# Patient Record
Sex: Male | Born: 1970 | Hispanic: No | Marital: Married | Smoking: Never smoker
Health system: Southern US, Community
[De-identification: ages and names within clinical notes are randomized; demographics above are authoritative.]

## PROBLEM LIST (undated history)

## (undated) DIAGNOSIS — L0390 Cellulitis, unspecified: Secondary | ICD-10-CM

## (undated) DIAGNOSIS — E119 Type 2 diabetes mellitus without complications: Secondary | ICD-10-CM

## (undated) DIAGNOSIS — I6529 Occlusion and stenosis of unspecified carotid artery: Secondary | ICD-10-CM

---

## 2009-06-22 ENCOUNTER — Emergency Department (HOSPITAL_COMMUNITY): Admission: EM | Admit: 2009-06-22 | Discharge: 2009-06-22 | Payer: Self-pay | Admitting: Emergency Medicine

## 2014-09-07 ENCOUNTER — Emergency Department (HOSPITAL_COMMUNITY): Payer: Self-pay

## 2014-09-07 ENCOUNTER — Encounter (HOSPITAL_COMMUNITY): Payer: Self-pay | Admitting: Emergency Medicine

## 2014-09-07 ENCOUNTER — Inpatient Hospital Stay (HOSPITAL_COMMUNITY)
Admission: EM | Admit: 2014-09-07 | Discharge: 2014-09-09 | DRG: 194 | Disposition: A | Discharge status: Home / Self Care | Payer: Self-pay | Attending: Internal Medicine | Admitting: Internal Medicine

## 2014-09-07 DIAGNOSIS — E871 Hypo-osmolality and hyponatremia: Secondary | ICD-10-CM

## 2014-09-07 DIAGNOSIS — R197 Diarrhea, unspecified: Secondary | ICD-10-CM | POA: Diagnosis present

## 2014-09-07 DIAGNOSIS — E1165 Type 2 diabetes mellitus with hyperglycemia: Secondary | ICD-10-CM | POA: Diagnosis present

## 2014-09-07 DIAGNOSIS — R109 Unspecified abdominal pain: Secondary | ICD-10-CM

## 2014-09-07 DIAGNOSIS — J181 Lobar pneumonia, unspecified organism: Principal | ICD-10-CM | POA: Diagnosis present

## 2014-09-07 DIAGNOSIS — R7401 Elevation of levels of liver transaminase levels: Secondary | ICD-10-CM

## 2014-09-07 DIAGNOSIS — R740 Nonspecific elevation of levels of transaminase and lactic acid dehydrogenase [LDH]: Secondary | ICD-10-CM | POA: Diagnosis present

## 2014-09-07 DIAGNOSIS — D696 Thrombocytopenia, unspecified: Secondary | ICD-10-CM

## 2014-09-07 DIAGNOSIS — F17220 Nicotine dependence, chewing tobacco, uncomplicated: Secondary | ICD-10-CM | POA: Diagnosis present

## 2014-09-07 LAB — COMPREHENSIVE METABOLIC PANEL
ALBUMIN: 3.5 g/dL (ref 3.5–5.0)
ALK PHOS: 164 U/L — AB (ref 38–126)
ALT: 92 U/L — ABNORMAL HIGH (ref 17–63)
ANION GAP: 9 (ref 5–15)
AST: 211 U/L — AB (ref 15–41)
BUN: 12 mg/dL (ref 6–20)
CHLORIDE: 96 mmol/L — AB (ref 101–111)
CO2: 21 mmol/L — ABNORMAL LOW (ref 22–32)
Calcium: 8 mg/dL — ABNORMAL LOW (ref 8.9–10.3)
Creatinine, Ser: 0.88 mg/dL (ref 0.61–1.24)
GFR calc Af Amer: >60 mL/min (ref >60)
GFR calc non Af Amer: >60 mL/min (ref >60)
GLUCOSE: 167 mg/dL — AB (ref 70–99)
POTASSIUM: 3.7 mmol/L (ref 3.5–5.1)
Sodium: 126 mmol/L — ABNORMAL LOW (ref 135–145)
TOTAL PROTEIN: 7.4 g/dL (ref 6.5–8.1)
Total Bilirubin: 1.5 mg/dL — ABNORMAL HIGH (ref 0.3–1.2)

## 2014-09-07 LAB — URINALYSIS, ROUTINE W REFLEX MICROSCOPIC
BILIRUBIN URINE: NEGATIVE
Glucose, UA: NEGATIVE mg/dL
Hgb urine dipstick: NEGATIVE
KETONES UR: NEGATIVE mg/dL
LEUKOCYTES UA: NEGATIVE
NITRITE: NEGATIVE
PH: 6.5 (ref 5.0–8.0)
Protein, ur: NEGATIVE mg/dL
SPECIFIC GRAVITY, URINE: 1.018 (ref 1.005–1.030)
Urobilinogen, UA: 0.2 mg/dL (ref 0.0–1.0)

## 2014-09-07 LAB — HIV ANTIBODY (ROUTINE TESTING W REFLEX): HIV SCREEN 4TH GENERATION: NONREACTIVE

## 2014-09-07 LAB — CBC WITH DIFFERENTIAL/PLATELET
Basophils Absolute: 0 10*3/uL (ref 0.0–0.1)
Basophils Relative: 0 % (ref 0–1)
Eosinophils Absolute: 0 10*3/uL (ref 0.0–0.7)
Eosinophils Relative: 0 % (ref 0–5)
HCT: 44.3 % (ref 39.0–52.0)
HEMOGLOBIN: 15.9 g/dL (ref 13.0–17.0)
LYMPHS ABS: 1.3 10*3/uL (ref 0.7–4.0)
Lymphocytes Relative: 20 % (ref 12–46)
MCH: 30.1 pg (ref 26.0–34.0)
MCHC: 35.9 g/dL (ref 30.0–36.0)
MCV: 83.7 fL (ref 78.0–100.0)
Monocytes Absolute: 0.7 10*3/uL (ref 0.1–1.0)
Monocytes Relative: 10 % (ref 3–12)
NEUTROS ABS: 4.6 10*3/uL (ref 1.7–7.7)
Neutrophils Relative %: 70 % (ref 43–77)
Platelets: 128 10*3/uL — ABNORMAL LOW (ref 150–400)
RBC: 5.29 MIL/uL (ref 4.22–5.81)
RDW: 12.6 % (ref 11.5–15.5)
WBC: 6.6 10*3/uL (ref 4.0–10.5)

## 2014-09-07 LAB — RAPID URINE DRUG SCREEN, HOSP PERFORMED
AMPHETAMINES: NOT DETECTED
BENZODIAZEPINES: NOT DETECTED
Barbiturates: NOT DETECTED
Cocaine: NOT DETECTED
OPIATES: NOT DETECTED
Tetrahydrocannabinol: NOT DETECTED

## 2014-09-07 LAB — TECHNOLOGIST SMEAR REVIEW

## 2014-09-07 LAB — LACTATE DEHYDROGENASE: LDH: 317 U/L — AB (ref 98–192)

## 2014-09-07 LAB — ETHANOL

## 2014-09-07 LAB — STREP PNEUMONIAE URINARY ANTIGEN: STREP PNEUMO URINARY ANTIGEN: NEGATIVE

## 2014-09-07 LAB — PROTIME-INR
INR: 0.91 (ref 0.00–1.49)
Prothrombin Time: 12.4 seconds (ref 11.6–15.2)

## 2014-09-07 LAB — LACTIC ACID, PLASMA: Lactic Acid, Venous: 1.1 mmol/L (ref 0.5–2.0)

## 2014-09-07 LAB — PROCALCITONIN: Procalcitonin: <0.1 ng/mL

## 2014-09-07 LAB — HEPATITIS B SURFACE ANTIGEN: HEP B S AG: NEGATIVE

## 2014-09-07 LAB — VITAMIN B12: Vitamin B-12: 461 pg/mL (ref 180–914)

## 2014-09-07 LAB — CLOSTRIDIUM DIFFICILE BY PCR: CDIFFPCR: NEGATIVE

## 2014-09-07 LAB — HEPATITIS C ANTIBODY: HCV AB: NEGATIVE

## 2014-09-07 LAB — APTT: aPTT: 32 seconds (ref 24–37)

## 2014-09-07 MED ORDER — ONDANSETRON HCL 4 MG/2ML IJ SOLN: 4.0000 mg | Freq: Four times a day (QID) | INTRAMUSCULAR | Status: DC | PRN

## 2014-09-07 MED ORDER — ONDANSETRON HCL 4 MG/2ML IJ SOLN
4.0000 mg | Freq: Once | INTRAMUSCULAR | Status: AC
  Administered 2014-09-07: 4 mg via INTRAVENOUS
  Filled 2014-09-07: qty 2

## 2014-09-07 MED ORDER — DEXTROSE 5 % IV SOLN
1.0000 g | Freq: Once | INTRAVENOUS | Status: AC
  Administered 2014-09-07: 1 g via INTRAVENOUS
  Filled 2014-09-07: qty 10

## 2014-09-07 MED ORDER — ACETAMINOPHEN 650 MG RE SUPP: 650.0000 mg | Freq: Four times a day (QID) | RECTAL | Status: DC | PRN

## 2014-09-07 MED ORDER — SODIUM CHLORIDE 0.9 % IV BOLUS (SEPSIS)
1000.0000 mL | Freq: Once | INTRAVENOUS | Status: AC
  Administered 2014-09-07: 1000 mL via INTRAVENOUS

## 2014-09-07 MED ORDER — POTASSIUM CHLORIDE 2 MEQ/ML IV SOLN
INTRAVENOUS | Status: DC
  Administered 2014-09-07 – 2014-09-09 (×4): via INTRAVENOUS
  Filled 2014-09-07 (×7): qty 1000

## 2014-09-07 MED ORDER — HEPARIN SODIUM (PORCINE) 5000 UNIT/ML IJ SOLN
5000.0000 [IU] | Freq: Three times a day (TID) | INTRAMUSCULAR | Status: DC
  Administered 2014-09-07 – 2014-09-09 (×6): 5000 [IU] via SUBCUTANEOUS
  Filled 2014-09-07 (×6): qty 1

## 2014-09-07 MED ORDER — ACETAMINOPHEN 325 MG PO TABS
650.0000 mg | ORAL_TABLET | Freq: Four times a day (QID) | ORAL | Status: DC | PRN
Start: 2014-09-07 — End: 2014-09-09
  Administered 2014-09-07: 650 mg via ORAL
  Filled 2014-09-07: qty 2

## 2014-09-07 MED ORDER — CEFTRIAXONE SODIUM IN DEXTROSE 20 MG/ML IV SOLN
1.0000 g | INTRAVENOUS | Status: DC
  Administered 2014-09-08 – 2014-09-09 (×2): 1 g via INTRAVENOUS
  Filled 2014-09-07 (×2): qty 50

## 2014-09-07 MED ORDER — DEXTROSE 5 % IV SOLN
500.0000 mg | Freq: Once | INTRAVENOUS | Status: AC
  Administered 2014-09-07: 500 mg via INTRAVENOUS
  Filled 2014-09-07: qty 500

## 2014-09-07 MED ORDER — ONDANSETRON HCL 4 MG PO TABS: 4.0000 mg | ORAL_TABLET | Freq: Four times a day (QID) | ORAL | Status: DC | PRN

## 2014-09-07 MED ORDER — IOHEXOL 300 MG/ML  SOLN
100.0000 mL | Freq: Once | INTRAMUSCULAR | Status: AC | PRN
  Administered 2014-09-07: 100 mL via INTRAVENOUS

## 2014-09-07 MED ORDER — IOHEXOL 300 MG/ML  SOLN
50.0000 mL | Freq: Once | INTRAMUSCULAR | Status: AC | PRN
  Administered 2014-09-07: 50 mL via ORAL

## 2014-09-07 MED ORDER — SODIUM CHLORIDE 0.9 % IJ SOLN
3.0000 mL | Freq: Two times a day (BID) | INTRAMUSCULAR | Status: DC
  Administered 2014-09-07: 3 mL via INTRAVENOUS

## 2014-09-07 MED ORDER — DEXTROSE 5 % IV SOLN
500.0000 mg | INTRAVENOUS | Status: DC
  Administered 2014-09-08 – 2014-09-09 (×2): 500 mg via INTRAVENOUS
  Filled 2014-09-07 (×2): qty 500

## 2014-09-07 MED ORDER — SULFAMETHOXAZOLE-TRIMETHOPRIM 800-160 MG PO TABS
1.0000 | ORAL_TABLET | Freq: Once | ORAL | Status: AC
  Administered 2014-09-07: 1 via ORAL
  Filled 2014-09-07: qty 1

## 2014-09-07 NOTE — H&P (Signed)
History and Physical  Bryan Mueller ZOX:[id number] DOB: 05/17/1970 DOA: 09/07/2014   PCP: No primary care provider on file.   Chief Complaint: cough, malaise, myalgia  HPI:  44 year old male who states that he has no chronic medical conditions presented with 4 day history of nonproductive cough and subjective fevers, chills, and rigors.  He did not have a thermometer to check his temperature. The patient has also been complaining of myalgias and arthralgias with bifrontal headache. He denies any focal extremity weakness or visual disturbance. He denies any neck pain. Finally, the patient has been having loose stools for the past 24 hours. He states that he had 5 watery stools on the day prior to admission without any hematochezia or melena. He has some lower abdominal pain for the past 24 hours. He denies any unusual rashes, synovitis, nausea, vomiting, chest pain, shortness of breath, hemoptysis. He has not seen a physician in 8 years. He is originally from [place] and has lived in [place] for 12 years. He denies any dysuria or hematuria.  In the emergency department, serum sodium was 126 with bicarbonate 21. AST 211, ALT 92, alkaline phosphatase is 164, total bilirubin 1.5. WBC was 6.6 with platelets 120,000. Urinalysis negative for pyuria. CT of the abdomen and pelvis shows bibasilar opacities, right greater than left. There is slight wall thickening of the sigmoid colon with a fluid-filled colon. Urinalysis is negative for pyuria Assessment/Plan: Lobar pneumonia -patient's clinical presentation is suspicious for an atypical pathogen given his headache, loose stools, not productive cough, and chest x-ray findings without overwhelming consolidation -Urine Legionella antigen -Urine Streptococcus pneumoniae antigen -Continue ceftriaxone and azithromycin -Try to induce sputum for PCP DFA -HIV antibody -Lactic acid -Procalcitonin -Respiratory viral panel -LDH -UDS Thrombocytopenia -May be  due to the patient's infectious process -HIV antibody -serum B12 -INR and PTT -Peripheral smear Diarrhea -C. difficile PCR -Stool pathogen panel Transaminasemia -no RUQ pain -likely due to infection -trend -CT shows normal GB -Hepatitis B surface antigen  -Hepatitis C antibody  Hyperglycemia  -Hemoglobin A1c  Hyponatremia -likely volume depletion vs atypical pneumonia -IVF -further workup if no improvement      History reviewed. No pertinent past medical history. History reviewed. No pertinent past surgical history. Social History:  reports that he has never smoked. His smokeless tobacco use includes Chew. He reports that he drinks alcohol. He reports that he does not use illicit drugs.   History reviewed. No pertinent family history.   No Known Allergies    Prior to Admission medications   Medication Sig Start Date End Date Taking? Authorizing Provider  guaifenesin (ROBITUSSIN) 100 MG/5ML syrup Take 200 mg by mouth 3 (three) times daily as needed for cough.   Yes Historical Provider, MD  ibuprofen (ADVIL,MOTRIN) 200 MG tablet Take 400 mg by mouth every 6 (six) hours as needed (for pain.).   Yes Historical Provider, MD  pseudoephedrine (SUDAFED) 30 MG tablet Take 30 mg by mouth every 4 (four) hours as needed for congestion.   Yes Historical Provider, MD    Review of Systems:  Constitutional:  No weight loss, night sweats,   Head&Eyes: No headache.  No vision loss.  No eye pain or scotoma ENT:  No Difficulty swallowing,Tooth/dental problems,Sore throat,   Cardio-vascular:  No chest pain, Orthopnea, PND, swelling in lower extremities,  dizziness, palpitations  GI:  No  nausea, vomiting,loss of appetite, hematochezia, melena, heartburn, indigestion, Resp:  No shortness of breath with exertion or at rest No coughing  up of blood .No wheezing.No chest wall deformity  Skin:  no rash or lesions.  GU:  no dysuria, change in color of urine, no urgency or frequency.  No flank pain.  Musculoskeletal:  No joint pain or swelling. No decreased range of motion. No back pain.  Psych:  No change in mood or affect. No depression or anxiety. Neurologic: No headache, no dysesthesia, no focal weakness, no vision loss. No syncope  Physical Exam: Filed Vitals:   09/07/14 0128  BP: 109/59  Pulse: 93  Temp: 99.4 F (37.4 C)  TempSrc: Oral  Resp: 18  SpO2: 98%   General:  A&O x 3, NAD, nontoxic, pleasant/cooperative Head/Eye: No conjunctival hemorrhage, no icterus, [place]/AT, No nystagmus ENT:  No icterus,  No thrush, good dentition, no pharyngeal exudate Neck:  No masses, no lymphadenpathy, no bruits, no meningismus CV:  RRR, no rub, no gallop, no S3 Lung:  Bibasilar rales, right greater than left. No wheeze Abdomen: soft/NT,RLQ, LLQ and suprapubic pain without rebound +BS, nondistended, no peritoneal signs Ext: No cyanosis, No rashes, No petechiae, No lymphangitis, No edema Neuro: CNII-XII intact, strength 4/5 in bilateral upper and lower extremities, no dysmetria  Labs on Admission:  Basic Metabolic Panel:  Recent Labs Lab 09/07/14 0505  NA 126*  K 3.7  CL 96*  CO2 21*  GLUCOSE 167*  BUN 12  CREATININE 0.88  CALCIUM 8.0*   Liver Function Tests:  Recent Labs Lab 09/07/14 0505  AST 211*  ALT 92*  ALKPHOS 164*  BILITOT 1.5*  PROT 7.4  ALBUMIN 3.5   No results for input(s): LIPASE, AMYLASE in the last 168 hours. No results for input(s): AMMONIA in the last 168 hours. CBC:  Recent Labs Lab 09/07/14 0505  WBC 6.6  NEUTROABS 4.6  HGB 15.9  HCT 44.3  MCV 83.7  PLT 128*   Cardiac Enzymes: No results for input(s): CKTOTAL, CKMB, CKMBINDEX, TROPONINI in the last 168 hours. BNP: Invalid input(s): POCBNP CBG: No results for input(s): GLUCAP in the last 168 hours.  Radiological Exams on Admission: Dg Chest 2 View  09/07/2014   CLINICAL DATA:  Acute onset of chest pain, cough, shortness of breath and fever. Initial encounter.  EXAM:  CHEST  2 VIEW  COMPARISON:  None.  FINDINGS: The lungs are well-aerated. Mild peribronchial thickening is noted. Mild right basilar opacity could reflect mild pneumonia. There is no evidence of pleural effusion or pneumothorax.  The heart is normal in size; the mediastinal contour is within normal limits. No acute osseous abnormalities are seen.  IMPRESSION: Mild right basilar opacity could reflect mild pneumonia. Mild peribronchial thickening noted.   Electronically Signed   By: Roanna RaiderJeffery  Chang M.D.   On: 09/07/2014 06:18   Ct Abdomen Pelvis W Contrast  09/07/2014   CLINICAL DATA:  Acute onset of fever, body aches and diarrhea. Nausea. Initial encounter.  EXAM: CT ABDOMEN AND PELVIS WITH CONTRAST  TECHNIQUE: Multidetector CT imaging of the abdomen and pelvis was performed using the standard protocol following bolus administration of intravenous contrast.  CONTRAST:  100mL OMNIPAQUE IOHEXOL 300 MG/ML  SOLN  COMPARISON:  None.  FINDINGS: Bibasilar airspace opacities are compatible with pneumonia, worse on the right.  The liver and spleen are unremarkable in appearance. The gallbladder is within normal limits. The pancreas and adrenal glands are unremarkable.  The kidneys are unremarkable in appearance. There is no evidence of hydronephrosis. No renal or ureteral stones are seen. No perinephric stranding is appreciated.  No free fluid  is identified. The small bowel is unremarkable in appearance. The stomach is within normal limits. No acute vascular abnormalities are seen.  A small umbilical hernia is noted, containing only fat.  The appendix is somewhat prominent, measuring up to 9 mm in diameter, but there is no associated soft tissue inflammation to suggest appendicitis. Contrast progresses to the level of the descending colon. The colon is largely filled with fluid. There is slightly increased wall enhancement along the sigmoid colon, which may reflect a mild infectious process, given the patient's symptoms.  The  bladder is mildly distended and grossly unremarkable. The prostate remains normal in size. No inguinal lymphadenopathy is seen.  No acute osseous abnormalities are identified.  IMPRESSION: 1. Bibasilar airspace opacities, compatible with pneumonia, worse on the right. 2. Slightly increased wall enhancement along the sigmoid colon may reflect a mild infectious process, given the patient's symptoms. The colon is largely filled with fluid. 3. Mild prominence of the appendix is thought to remain within normal limits, given the lack of soft tissue inflammation. 4. Small umbilical hernia, containing only fat.   Electronically Signed   By: Roanna Raider M.D.   On: 09/07/2014 06:52       Time spent:70 minutes Code Status:   FULL Family Communication:   Wife updated at bedside   Bryan Rissler, DO  [facility] Pager [phone]  If 7PM-7AM, please contact night-coverage [email or web address] Password Bryan Mueller 09/07/2014, 7:54 AM

## 2014-09-07 NOTE — ED Provider Notes (Signed)
CSN: [id number]     Arrival date & time 09/07/14  0059 History   First MD Initiated Contact with Patient 09/07/14 [phone]     Chief Complaint  Patient presents with  . Fever  . Diarrhea     (Consider location/radiation/quality/duration/timing/severity/associated sxs/prior Treatment) Patient is a 44 y.o. male presenting with general illness.  Illness Location:  Generalized, abd, cough Quality:  Aching, cough, nausea, diarrhea Severity:  Moderate Onset quality:  Gradual Duration:  4 days Timing:  Constant Progression:  Unchanged Chronicity:  New Context:  Spontaneous, no sick contacts Relieved by:  Nothing Worsened by:  Nothing Associated symptoms: abdominal pain, cough, diarrhea and nausea   Associated symptoms: no chest pain, no rhinorrhea, no shortness of breath and no vomiting     History reviewed. No pertinent past medical history. History reviewed. No pertinent past surgical history. History reviewed. No pertinent family history. History  Substance Use Topics  . Smoking status: Never Smoker   . Smokeless tobacco: Current User    Types: Chew  . Alcohol Use: Yes    Review of Systems  HENT: Negative for rhinorrhea.   Respiratory: Positive for cough. Negative for shortness of breath.   Cardiovascular: Negative for chest pain.  Gastrointestinal: Positive for nausea, abdominal pain and diarrhea. Negative for vomiting.  All other systems reviewed and are negative.     Allergies  Review of patient's allergies indicates no known allergies.  Home Medications   Prior to Admission medications   Medication Sig Start Date End Date Taking? Authorizing Provider  guaifenesin (ROBITUSSIN) 100 MG/5ML syrup Take 200 mg by mouth 3 (three) times daily as needed for cough.   Yes Historical Provider, MD  ibuprofen (ADVIL,MOTRIN) 200 MG tablet Take 400 mg by mouth every 6 (six) hours as needed (for pain.).   Yes Historical Provider, MD  pseudoephedrine (SUDAFED) 30 MG tablet Take 30  mg by mouth every 4 (four) hours as needed for congestion.   Yes Historical Provider, MD   BP 98/56 mmHg  Pulse 80  Temp(Src) 98.5 F (36.9 C) (Oral)  Resp 20  Ht  (1.676 m)  Wt 185 lb 8 oz (84.142 kg)  BMI 29.95 kg/m2  SpO2 100% Physical Exam  Constitutional: He is oriented to person, place, and time. He appears well-developed and well-nourished.  HENT:  Head: Normocephalic and atraumatic.  Eyes: Conjunctivae and EOM are normal.  Neck: Normal range of motion. Neck supple.  Cardiovascular: Normal rate, regular rhythm and normal heart sounds.   Pulmonary/Chest: Effort normal and breath sounds normal. No respiratory distress.  Abdominal: He exhibits no distension. There is generalized tenderness (mild). There is no rebound and no guarding.  Musculoskeletal: Normal range of motion.  Neurological: He is alert and oriented to person, place, and time.  Skin: Skin is warm and dry.  Vitals reviewed.   ED Course  Procedures (including critical care time) Labs Review Labs Reviewed  CBC WITH DIFFERENTIAL/PLATELET - Abnormal; Notable for the following:    Platelets 128 (*)    All other components within normal limits  COMPREHENSIVE METABOLIC PANEL - Abnormal; Notable for the following:    Sodium 126 (*)    Chloride 96 (*)    CO2 21 (*)    Glucose, Bld 167 (*)    Calcium 8.0 (*)    AST 211 (*)    ALT 92 (*)    Alkaline Phosphatase 164 (*)    Total Bilirubin 1.5 (*)    All other components within normal  limits  LACTATE DEHYDROGENASE - Abnormal; Notable for the following:    LDH 317 (*)    All other components within normal limits  COMPREHENSIVE METABOLIC PANEL - Abnormal; Notable for the following:    Sodium 134 (*)    Glucose, Bld 128 (*)    Calcium 7.8 (*)    Total Protein 6.2 (*)    Albumin 2.8 (*)    AST 73 (*)    All other components within normal limits  CBC WITH DIFFERENTIAL/PLATELET - Abnormal; Notable for the following:    Platelets 107 (*)    All other  components within normal limits  CLOSTRIDIUM DIFFICILE BY PCR  CULTURE, BLOOD (ROUTINE X 2)  CULTURE, BLOOD (ROUTINE X 2)  PNEUMOCYSTIS JIROVECI SMEAR BY DFA  RESPIRATORY VIRUS PANEL  URINALYSIS, ROUTINE W REFLEX MICROSCOPIC  ETHANOL  HIV ANTIBODY (ROUTINE TESTING)  LACTIC ACID, PLASMA  STREP PNEUMONIAE URINARY ANTIGEN  VITAMIN B12  PROTIME-INR  APTT  TECHNOLOGIST SMEAR REVIEW  HEPATITIS B SURFACE ANTIGEN  HEPATITIS C ANTIBODY  PROCALCITONIN  URINE RAPID DRUG SCREEN (HOSP PERFORMED)  GI PATHOGEN PANEL BY PCR, STOOL  LEGIONELLA ANTIGEN, URINE  HEMOGLOBIN A1C    Imaging Review Dg Chest 2 View  09/07/2014   CLINICAL DATA:  Acute onset of chest pain, cough, shortness of breath and fever. Initial encounter.  EXAM: CHEST  2 VIEW  COMPARISON:  None.  FINDINGS: The lungs are well-aerated. Mild peribronchial thickening is noted. Mild right basilar opacity could reflect mild pneumonia. There is no evidence of pleural effusion or pneumothorax.  The heart is normal in size; the mediastinal contour is within normal limits. No acute osseous abnormalities are seen.  IMPRESSION: Mild right basilar opacity could reflect mild pneumonia. Mild peribronchial thickening noted.   Electronically Signed   By: Roanna Raider M.D.   On: 09/07/2014 06:18   Ct Abdomen Pelvis W Contrast  09/07/2014   CLINICAL DATA:  Acute onset of fever, body aches and diarrhea. Nausea. Initial encounter.  EXAM: CT ABDOMEN AND PELVIS WITH CONTRAST  TECHNIQUE: Multidetector CT imaging of the abdomen and pelvis was performed using the standard protocol following bolus administration of intravenous contrast.  CONTRAST:  OMNIPAQUE IOHEXOL 300 MG/ML  SOLN  COMPARISON:  None.  FINDINGS: Bibasilar airspace opacities are compatible with pneumonia, worse on the right.  The liver and spleen are unremarkable in appearance. The gallbladder is within normal limits. The pancreas and adrenal glands are unremarkable.  The kidneys are  unremarkable in appearance. There is no evidence of hydronephrosis. No renal or ureteral stones are seen. No perinephric stranding is appreciated.  No free fluid is identified. The small bowel is unremarkable in appearance. The stomach is within normal limits. No acute vascular abnormalities are seen.  A small umbilical hernia is noted, containing only fat.  The appendix is somewhat prominent, measuring up to 9 mm in diameter, but there is no associated soft tissue inflammation to suggest appendicitis. Contrast progresses to the level of the descending colon. The colon is largely filled with fluid. There is slightly increased wall enhancement along the sigmoid colon, which may reflect a mild infectious process, given the patient's symptoms.  The bladder is mildly distended and grossly unremarkable. The prostate remains normal in size. No inguinal lymphadenopathy is seen.  No acute osseous abnormalities are identified.  IMPRESSION: 1. Bibasilar airspace opacities, compatible with pneumonia, worse on the right. 2. Slightly increased wall enhancement along the sigmoid colon may reflect a mild infectious process, given the  patient's symptoms. The colon is largely filled with fluid. 3. Mild prominence of the appendix is thought to remain within normal limits, given the lack of soft tissue inflammation. 4. Small umbilical hernia, containing only fat.   Electronically Signed   By: Roanna RaiderJeffery  Chang M.D.   On: 09/07/2014 06:52     EKG Interpretation None       CRITICAL CARE Performed by: Mirian MoMatthew Gentry   Total critical care time: 35 min  Critical care time was exclusive of separately billable procedures and treating other patients.  Critical care was necessary to treat or prevent imminent or life-threatening deterioration.  Critical care was time spent personally by me on the following activities: development of treatment plan with patient and/or surrogate as well as nursing, discussions with consultants,  evaluation of patient's response to treatment, examination of patient, obtaining history from patient or surrogate, ordering and performing treatments and interventions, ordering and review of laboratory studies, ordering and review of radiographic studies, pulse oximetry and re-evaluation of patient's condition.   MDM   Final diagnoses:  Abdominal pain    44 y.o. male without pertinent PMH presents with generalized aching, abd pain, nausea, diarrhea, and cough.  On arrival vitals and physical exam as above.  Pt febrile and tachypneic on reeval, meeting sepsis criteria.  Required NS bolus x 2 and multiple abx.  Admitted to hospitalist for CAP, bactrim given for possibility of PCP given multilobar status in pt with poor history and unusual appearance.  I have reviewed all laboratory and imaging studies if ordered as above  1. Abdominal pain         Mirian MoMatthew Gentry, MD 09/08/14 0900

## 2014-09-07 NOTE — ED Notes (Signed)
Pt arrived to the ED with a complaint of fever, body aches, diarrhea.  Pt states that he has had symptoms for four days.  Pt states diarrhea just started today.  Pt statess he is nauseated but has not had any episodes of emesis.  Pt states generalized body aches.  Pt also complains of a headache.

## 2014-09-08 DIAGNOSIS — R109 Unspecified abdominal pain: Secondary | ICD-10-CM

## 2014-09-08 LAB — CBC WITH DIFFERENTIAL/PLATELET
BASOS ABS: 0.1 10*3/uL (ref 0.0–0.1)
BASOS PCT: 1 % (ref 0–1)
EOS PCT: 1 % (ref 0–5)
Eosinophils Absolute: 0.1 10*3/uL (ref 0.0–0.7)
HCT: 40.7 % (ref 39.0–52.0)
Hemoglobin: 14.4 g/dL (ref 13.0–17.0)
Lymphocytes Relative: 43 % (ref 12–46)
Lymphs Abs: 2.6 10*3/uL (ref 0.7–4.0)
MCH: 30.3 pg (ref 26.0–34.0)
MCHC: 35.4 g/dL (ref 30.0–36.0)
MCV: 85.5 fL (ref 78.0–100.0)
MONO ABS: 0.7 10*3/uL (ref 0.1–1.0)
MONOS PCT: 12 % (ref 3–12)
NEUTROS ABS: 2.5 10*3/uL (ref 1.7–7.7)
Neutrophils Relative %: 43 % (ref 43–77)
Platelets: 107 10*3/uL — ABNORMAL LOW (ref 150–400)
RBC: 4.76 MIL/uL (ref 4.22–5.81)
RDW: 12.8 % (ref 11.5–15.5)
WBC: 6 10*3/uL (ref 4.0–10.5)

## 2014-09-08 LAB — COMPREHENSIVE METABOLIC PANEL
ALT: 60 U/L (ref 17–63)
AST: 73 U/L — ABNORMAL HIGH (ref 15–41)
Albumin: 2.8 g/dL — ABNORMAL LOW (ref 3.5–5.0)
Alkaline Phosphatase: 126 U/L (ref 38–126)
Anion gap: 8 (ref 5–15)
BUN: 9 mg/dL (ref 6–20)
CO2: 24 mmol/L (ref 22–32)
CREATININE: 0.89 mg/dL (ref 0.61–1.24)
Calcium: 7.8 mg/dL — ABNORMAL LOW (ref 8.9–10.3)
Chloride: 102 mmol/L (ref 101–111)
GFR calc Af Amer: >60 mL/min (ref >60)
GLUCOSE: 128 mg/dL — AB (ref 70–99)
Potassium: 3.7 mmol/L (ref 3.5–5.1)
Sodium: 134 mmol/L — ABNORMAL LOW (ref 135–145)
TOTAL PROTEIN: 6.2 g/dL — AB (ref 6.5–8.1)
Total Bilirubin: 0.7 mg/dL (ref 0.3–1.2)

## 2014-09-08 LAB — LEGIONELLA ANTIGEN, URINE

## 2014-09-08 LAB — HEMOGLOBIN A1C
HEMOGLOBIN A1C: 8.1 % — AB (ref 4.8–5.6)
MEAN PLASMA GLUCOSE: 186 mg/dL

## 2014-09-08 MED ORDER — METRONIDAZOLE IN NACL 5-0.79 MG/ML-% IV SOLN
500.0000 mg | Freq: Three times a day (TID) | INTRAVENOUS | Status: DC
  Administered 2014-09-08 – 2014-09-09 (×4): 500 mg via INTRAVENOUS
  Filled 2014-09-08 (×4): qty 100

## 2014-09-08 MED ORDER — BENZONATATE 100 MG PO CAPS
100.0000 mg | ORAL_CAPSULE | Freq: Two times a day (BID) | ORAL | Status: DC | PRN
  Administered 2014-09-08: 100 mg via ORAL
  Filled 2014-09-08: qty 1

## 2014-09-08 MED ORDER — MENTHOL 3 MG MT LOZG
1.0000 | LOZENGE | OROMUCOSAL | Status: DC | PRN
  Filled 2014-09-08: qty 9

## 2014-09-08 NOTE — Progress Notes (Addendum)
[facility] PROGRESS NOTE  Lucilla LameKepson Wiltgen TFT:[id number]:[id number] DOB: 03/12/1971 DOA: 09/07/2014 PCP: No primary care provider on file.  Assessment/Plan: Active Problems:   Lobar pneumonia   Hyponatremia   Thrombocytopenia   Transaminasemia     Lobar pneumonia  patient on Rocephin and azithromycin -Urine Legionella antigen negative -Urine Streptococcus pneumoniae antigen negative -Continue ceftriaxone and azithromycin -HIV antibody negative -Lactic acid within normal limits -Procalcitonin negative -Respiratory viral panel pending -LDH mildly elevated, nonspecific -UDS  negative  Thrombocytopenia -May be due to the patient's infectious process -HIV antibody negative -serum B12 within normal limits  continue to monitor  Diarrhea -CT scan suggestive of sigmoiditis ? -C. difficile PCR negative -Stool pathogen panel , pending  Will add Flagyl to the patient's current regimen of antibiotics  Transaminasemia  mild increase in AST , likely nonspecific we'll repeat in the morning -likely due to infection -trend -CT shows normal GB -Hepatitis B surface antigen  negative -Hepatitis C antibody  negative   Hyperglycemia  -Hemoglobin A1c   8.1 , new diagnosis for him?   Hyponatremia -likely volume depletion vs atypical pneumonia -IVF -further workup if no improvement   Code Status: full Family Communication: family updated about patient's clinical progress Disposition Plan:  As above    Brief narrative: 44 year old male who states that he has no chronic medical conditions presented with 4 day history of nonproductive cough and subjective fevers, chills, and rigors. He did not have a thermometer to check his temperature. The patient has also been complaining of myalgias and arthralgias with bifrontal headache. He denies any focal extremity weakness or visual disturbance. He denies any neck pain. Finally, the patient has been having loose stools for the past 24 hours. He  states that he had 5 watery stools on the day prior to admission without any hematochezia or melena. He has some lower abdominal pain for the past 24 hours. He denies any unusual rashes, synovitis, nausea, vomiting, chest pain, shortness of breath, hemoptysis. He has not seen a physician in 8 years. He is originally from [place] and has lived in [place] for 12 years. He denies any dysuria or hematuria.  In the emergency department, serum sodium was 126 with bicarbonate 21. AST 211, ALT 92, alkaline phosphatase is 164, total bilirubin 1.5. WBC was 6.6 with platelets 120,000. Urinalysis negative for pyuria. CT of the abdomen and pelvis shows bibasilar opacities, right greater than left. There is slight wall thickening of the sigmoid colon with a fluid-filled colon. Urinalysis is negative for pyuria  Consultants:   none    procedure none  Antibiotics:  Rocephin azithromycin  HPI/Subjective:  patient has a productive cough, blood pressure still soft, fever yesterday afternoon  Objective: Filed Vitals:   09/07/14 1514 09/07/14 1700 09/07/14 2252 09/08/14 0620  BP: 113/69  117/61 98/56  Pulse: 92  100 80  Temp: 100.7 F (38.2 C)  102.3 F (39.1 C) 98.5 F (36.9 C)  TempSrc: Oral  Oral Oral  Resp: 22  20 20   Height:      Weight:  84.142 kg (185 lb 8 oz)    SpO2: 99%  98% 100%    Intake/Output Summary (Last 24 hours) at 09/08/14 1328 Last data filed at 09/08/14 0646  Gross per 24 hour  Intake   2355 ml  Output      0 ml  Net   2355 ml    Exam:  General: No acute respiratory distress Lungs: Clear to auscultation bilaterally without wheezes or crackles Cardiovascular:  Regular rate and rhythm without murmur gallop or rub normal S1 and S2 Abdomen: Nontender, nondistended, soft, bowel sounds positive, no rebound, no ascites, no appreciable mass Extremities: No significant cyanosis, clubbing, or edema bilateral lower extremities      Data Reviewed: Basic Metabolic  Panel:  Recent Labs Lab 09/07/14 0505 09/08/14 0455  NA 126* 134*  K 3.7 3.7  CL 96* 102  CO2 21* 24  GLUCOSE 167* 128*  BUN 12 9  CREATININE 0.88 0.89  CALCIUM 8.0* 7.8*    Liver Function Tests:  Recent Labs Lab 09/07/14 0505 09/08/14 0455  AST 211* 73*  ALT 92* 60  ALKPHOS 164* 126  BILITOT 1.5* 0.7  PROT 7.4 6.2*  ALBUMIN 3.5 2.8*   No results for input(s): LIPASE, AMYLASE in the last 168 hours. No results for input(s): AMMONIA in the last 168 hours.  CBC:  Recent Labs Lab 09/07/14 0505 09/08/14 0455  WBC 6.6 6.0  NEUTROABS 4.6 2.5  HGB 15.9 14.4  HCT 44.3 40.7  MCV 83.7 85.5  PLT 128* 107*    Cardiac Enzymes: No results for input(s): CKTOTAL, CKMB, CKMBINDEX, TROPONINI in the last 168 hours. BNP (last 3 results) No results for input(s): BNP in the last 8760 hours.  ProBNP (last 3 results) No results for input(s): PROBNP in the last 8760 hours.    CBG: No results for input(s): GLUCAP in the last 168 hours.  Recent Results (from the past 240 hour(s))  Blood culture (routine x 2)     Status: None (Preliminary result)   Collection Time: 09/07/14  7:35 AM  Result Value Ref Range Status   Specimen Description BLOOD RIGHT HAND  Final   Special Requests BOTTLES DRAWN AEROBIC AND ANAEROBIC 5 CC EACH  Final   Culture   Final           BLOOD CULTURE RECEIVED NO GROWTH TO DATE CULTURE WILL BE HELD FOR 5 DAYS BEFORE ISSUING A FINAL NEGATIVE REPORT Performed at [facility]    Report Status PENDING  Incomplete  Blood culture (routine x 2)     Status: None (Preliminary result)   Collection Time: 09/07/14  7:45 AM  Result Value Ref Range Status   Specimen Description BLOOD LEFT HAND  Final   Special Requests BOTTLES DRAWN AEROBIC ONLY 3CC  Final   Culture   Final           BLOOD CULTURE RECEIVED NO GROWTH TO DATE CULTURE WILL BE HELD FOR 5 DAYS BEFORE ISSUING A FINAL NEGATIVE REPORT Performed at [facility]    Report Status PENDING   Incomplete  Clostridium Difficile by PCR     Status: None   Collection Time: 09/07/14  4:38 PM  Result Value Ref Range Status   C difficile by pcr NEGATIVE NEGATIVE Final     Studies: Dg Chest 2 View  09/07/2014   CLINICAL DATA:  Acute onset of chest pain, cough, shortness of breath and fever. Initial encounter.  EXAM: CHEST  2 VIEW  COMPARISON:  None.  FINDINGS: The lungs are well-aerated. Mild peribronchial thickening is noted. Mild right basilar opacity could reflect mild pneumonia. There is no evidence of pleural effusion or pneumothorax.  The heart is normal in size; the mediastinal contour is within normal limits. No acute osseous abnormalities are seen.  IMPRESSION: Mild right basilar opacity could reflect mild pneumonia. Mild peribronchial thickening noted.   Electronically Signed   By: Roanna Raider M.D.   On: 09/07/2014  06:18   Ct Abdomen Pelvis W Contrast  09/07/2014   CLINICAL DATA:  Acute onset of fever, body aches and diarrhea. Nausea. Initial encounter.  EXAM: CT ABDOMEN AND PELVIS WITH CONTRAST  TECHNIQUE: Multidetector CT imaging of the abdomen and pelvis was performed using the standard protocol following bolus administration of intravenous contrast.  CONTRAST:  OMNIPAQUE IOHEXOL 300 MG/ML  SOLN  COMPARISON:  None.  FINDINGS: Bibasilar airspace opacities are compatible with pneumonia, worse on the right.  The liver and spleen are unremarkable in appearance. The gallbladder is within normal limits. The pancreas and adrenal glands are unremarkable.  The kidneys are unremarkable in appearance. There is no evidence of hydronephrosis. No renal or ureteral stones are seen. No perinephric stranding is appreciated.  No free fluid is identified. The small bowel is unremarkable in appearance. The stomach is within normal limits. No acute vascular abnormalities are seen.  A small umbilical hernia is noted, containing only fat.  The appendix is somewhat prominent, measuring up to 9 mm in  diameter, but there is no associated soft tissue inflammation to suggest appendicitis. Contrast progresses to the level of the descending colon. The colon is largely filled with fluid. There is slightly increased wall enhancement along the sigmoid colon, which may reflect a mild infectious process, given the patient's symptoms.  The bladder is mildly distended and grossly unremarkable. The prostate remains normal in size. No inguinal lymphadenopathy is seen.  No acute osseous abnormalities are identified.  IMPRESSION: 1. Bibasilar airspace opacities, compatible with pneumonia, worse on the right. 2. Slightly increased wall enhancement along the sigmoid colon may reflect a mild infectious process, given the patient's symptoms. The colon is largely filled with fluid. 3. Mild prominence of the appendix is thought to remain within normal limits, given the lack of soft tissue inflammation. 4. Small umbilical hernia, containing only fat.   Electronically Signed   By: Roanna Raider M.D.   On: 09/07/2014 06:52    Scheduled Meds: . azithromycin  500 mg Intravenous Q24H  . cefTRIAXone (ROCEPHIN)  IV  1 g Intravenous Q24H  . heparin  5,000 Units Subcutaneous 3 times per day  . sodium chloride  3 mL Intravenous Q12H   Continuous Infusions: . sodium chloride 0.9 % 1,000 mL with potassium chloride 20 mEq infusion 100 mL/hr at 09/08/14 0219    Active Problems:   Lobar pneumonia   Hyponatremia   Thrombocytopenia   Transaminasemia    Time spent: 40 minutes   [facility]  [facility] Pager [phone]. If 7PM-7AM, please contact night-coverage at [email or web address], password [facility] 09/08/2014, 1:28 PM  LOS: 1 day

## 2014-09-09 LAB — RESPIRATORY VIRUS PANEL
Adenovirus: NEGATIVE
INFLUENZA A: POSITIVE — AB
Influenza B: POSITIVE — AB
Metapneumovirus: NEGATIVE
Parainfluenza 1: NEGATIVE
Parainfluenza 2: NEGATIVE
Parainfluenza 3: NEGATIVE
RESPIRATORY SYNCYTIAL VIRUS A: NEGATIVE
RESPIRATORY SYNCYTIAL VIRUS B: NEGATIVE
Rhinovirus: NEGATIVE

## 2014-09-09 LAB — PROCALCITONIN: Procalcitonin: <0.1 ng/mL

## 2014-09-09 MED ORDER — GLIPIZIDE 5 MG PO TABS: 2.5000 mg | ORAL_TABLET | Freq: Every day | ORAL | Status: DC

## 2014-09-09 MED ORDER — BENZONATATE 100 MG PO CAPS: 100.0000 mg | ORAL_CAPSULE | Freq: Two times a day (BID) | ORAL | Status: DC | PRN

## 2014-09-09 MED ORDER — LEVOFLOXACIN 500 MG PO TABS: 500.0000 mg | ORAL_TABLET | Freq: Every day | ORAL | Status: DC

## 2014-09-09 MED ORDER — METRONIDAZOLE 500 MG PO TABS: 500.0000 mg | ORAL_TABLET | Freq: Three times a day (TID) | ORAL | Status: DC

## 2014-09-09 NOTE — Progress Notes (Signed)
Discharge instructions given to patient and family no questions .  Sharrell Ku Oprah Camarena RN

## 2014-09-09 NOTE — Progress Notes (Signed)
Ambulated in hall, o2 sats 97 to 100 %   D [employer] RN

## 2014-09-09 NOTE — Discharge Summary (Signed)
Physician Discharge Summary  Bryan Mueller MRN: [id number] DOB/AGE: Mar 07, 1971 44 y.o.  PCP: No primary care provider on file.   Admit date: 09/07/2014 Discharge date: 09/09/2014  Discharge Diagnoses:     Active Problems:   Lobar pneumonia   Hyponatremia   Thrombocytopenia   Transaminasemia  new onset diabetes mellitus type 2   Follow-up recommendations Follow-up with PCP in 3-5 days Follow-up CBC, CMP in 1 week    Medication List    STOP taking these medications        ibuprofen 200 MG tablet  Commonly known as:  ADVIL,MOTRIN      TAKE these medications        benzonatate 100 MG capsule  Commonly known as:  TESSALON  Take 1 capsule (100 mg total) by mouth 2 (two) times daily as needed for cough.     glipiZIDE 5 MG tablet  Commonly known as:  GLUCOTROL  Take 0.5 tablets (2.5 mg total) by mouth daily before breakfast.     guaifenesin 100 MG/5ML syrup  Commonly known as:  ROBITUSSIN  Take 200 mg by mouth 3 (three) times daily as needed for cough.     levofloxacin 500 MG tablet  Commonly known as:  LEVAQUIN  Take 1 tablet (500 mg total) by mouth daily.     metroNIDAZOLE 500 MG tablet  Commonly known as:  FLAGYL  Take 1 tablet (500 mg total) by mouth 3 (three) times daily.     pseudoephedrine 30 MG tablet  Commonly known as:  SUDAFED  Take 30 mg by mouth every 4 (four) hours as needed for congestion.        Discharge Condition: Stable   Disposition: Final discharge disposition not confirmed   Consults:  None    Significant Diagnostic Studies: Dg Chest 2 View  09/07/2014   CLINICAL DATA:  Acute onset of chest pain, cough, shortness of breath and fever. Initial encounter.  EXAM: CHEST  2 VIEW  COMPARISON:  None.  FINDINGS: The lungs are well-aerated. Mild peribronchial thickening is noted. Mild right basilar opacity could reflect mild pneumonia. There is no evidence of pleural effusion or pneumothorax.  The heart is normal in size; the mediastinal  contour is within normal limits. No acute osseous abnormalities are seen.  IMPRESSION: Mild right basilar opacity could reflect mild pneumonia. Mild peribronchial thickening noted.   Electronically Signed   By: Garald Balding M.D.   On: 09/07/2014 06:18   Ct Abdomen Pelvis W Contrast  09/07/2014   CLINICAL DATA:  Acute onset of fever, body aches and diarrhea. Nausea. Initial encounter.  EXAM: CT ABDOMEN AND PELVIS WITH CONTRAST  TECHNIQUE: Multidetector CT imaging of the abdomen and pelvis was performed using the standard protocol following bolus administration of intravenous contrast.  CONTRAST:  171m OMNIPAQUE IOHEXOL 300 MG/ML  SOLN  COMPARISON:  None.  FINDINGS: Bibasilar airspace opacities are compatible with pneumonia, worse on the right.  The liver and spleen are unremarkable in appearance. The gallbladder is within normal limits. The pancreas and adrenal glands are unremarkable.  The kidneys are unremarkable in appearance. There is no evidence of hydronephrosis. No renal or ureteral stones are seen. No perinephric stranding is appreciated.  No free fluid is identified. The small bowel is unremarkable in appearance. The stomach is within normal limits. No acute vascular abnormalities are seen.  A small umbilical hernia is noted, containing only fat.  The appendix is somewhat prominent, measuring up to 9 mm in diameter, but there  is no associated soft tissue inflammation to suggest appendicitis. Contrast progresses to the level of the descending colon. The colon is largely filled with fluid. There is slightly increased wall enhancement along the sigmoid colon, which may reflect a mild infectious process, given the patient's symptoms.  The bladder is mildly distended and grossly unremarkable. The prostate remains normal in size. No inguinal lymphadenopathy is seen.  No acute osseous abnormalities are identified.  IMPRESSION: 1. Bibasilar airspace opacities, compatible with pneumonia, worse on the right. 2.  Slightly increased wall enhancement along the sigmoid colon may reflect a mild infectious process, given the patient's symptoms. The colon is largely filled with fluid. 3. Mild prominence of the appendix is thought to remain within normal limits, given the lack of soft tissue inflammation. 4. Small umbilical hernia, containing only fat.   Electronically Signed   By: Garald Balding M.D.   On: 09/07/2014 06:52      Microbiology: Recent Results (from the past 240 hour(s))  Blood culture (routine x 2)     Status: None (Preliminary result)   Collection Time: 09/07/14  7:35 AM  Result Value Ref Range Status   Specimen Description BLOOD RIGHT HAND  Final   Special Requests BOTTLES DRAWN AEROBIC AND ANAEROBIC 5 CC EACH  Final   Culture   Final           BLOOD CULTURE RECEIVED NO GROWTH TO DATE CULTURE WILL BE HELD FOR 5 DAYS BEFORE ISSUING A FINAL NEGATIVE REPORT Performed at [facility]    Report Status PENDING  Incomplete  Blood culture (routine x 2)     Status: None (Preliminary result)   Collection Time: 09/07/14  7:45 AM  Result Value Ref Range Status   Specimen Description BLOOD LEFT HAND  Final   Special Requests BOTTLES DRAWN AEROBIC ONLY 3CC  Final   Culture   Final           BLOOD CULTURE RECEIVED NO GROWTH TO DATE CULTURE WILL BE HELD FOR 5 DAYS BEFORE ISSUING A FINAL NEGATIVE REPORT Performed at [facility]    Report Status PENDING  Incomplete  Clostridium Difficile by PCR     Status: None   Collection Time: 09/07/14  4:38 PM  Result Value Ref Range Status   C difficile by pcr NEGATIVE NEGATIVE Final     Labs: Results for orders placed or performed during the hospital encounter of 09/07/14 (from the past 48 hour(s))  Vitamin B12     Status: None   Collection Time: 09/07/14  9:29 AM  Result Value Ref Range   Vitamin B-12 461 180 - 914 pg/mL    Comment: (NOTE) This assay is not validated for testing neonatal or myeloproliferative syndrome specimens for  Vitamin B12 levels. Performed at [facility]   Hemoglobin A1c     Status: Abnormal   Collection Time: 09/07/14  9:29 AM  Result Value Ref Range   Hgb A1c MFr Bld 8.1 (H) 4.8 - 5.6 %    Comment: (NOTE)         Pre-diabetes: 5.7 - 6.4         Diabetes: >6.4         Glycemic control for adults with diabetes: <7.0    Mean Plasma Glucose 186 mg/dL    Comment: (NOTE) Performed At: [facility] [place], [place] [id number] Lindon Romp MD [id number]   Protime-INR     Status: None   Collection Time: 09/07/14  9:29 AM  Result Value Ref Range   Prothrombin Time 12.4 11.6 - 15.2 seconds   INR 0.91 0.00 - 1.49  APTT     Status: None   Collection Time: 09/07/14  9:29 AM  Result Value Ref Range   aPTT 32 24 - 37 seconds  Technologist smear review     Status: None   Collection Time: 09/07/14  9:29 AM  Result Value Ref Range   Tech Review MORPHOLOGY UNREMARKABLE   Hepatitis B surface antigen     Status: None   Collection Time: 09/07/14  9:29 AM  Result Value Ref Range   Hepatitis B Surface Ag NEGATIVE NEGATIVE    Comment: Performed at [facility]  Hepatitis C antibody     Status: None   Collection Time: 09/07/14  9:29 AM  Result Value Ref Range   HCV Ab NEGATIVE NEGATIVE    Comment: Performed at [facility]  Procalcitonin - Baseline     Status: None   Collection Time: 09/07/14  9:29 AM  Result Value Ref Range   Procalcitonin <0.10 ng/mL    Comment:        Interpretation: PCT (Procalcitonin) <= 0.5 ng/mL: Systemic infection (sepsis) is not likely. Local bacterial infection is possible. (NOTE)         ICU PCT Algorithm               Non ICU PCT Algorithm    ----------------------------     ------------------------------         PCT < 0.25 ng/mL                 PCT < 0.1 ng/mL     Stopping of antibiotics            Stopping of antibiotics       strongly encouraged.               strongly encouraged.     ----------------------------     ------------------------------       PCT level decrease by               PCT < 0.25 ng/mL       >= 80% from peak PCT       OR PCT 0.25 - 0.5 ng/mL          Stopping of antibiotics                                             encouraged.     Stopping of antibiotics           encouraged.    ----------------------------     ------------------------------       PCT level decrease by              PCT >= 0.25 ng/mL       < 80% from peak PCT        AND PCT >= 0.5 ng/mL            Continuin g antibiotics                                              encouraged.       Continuing antibiotics  encouraged.    ----------------------------     ------------------------------     PCT level increase compared          PCT > 0.5 ng/mL         with peak PCT AND          PCT >= 0.5 ng/mL             Escalation of antibiotics                                          strongly encouraged.      Escalation of antibiotics        strongly encouraged.   Clostridium Difficile by PCR     Status: None   Collection Time: 09/07/14  4:38 PM  Result Value Ref Range   C difficile by pcr NEGATIVE NEGATIVE  Legionella antigen, urine     Status: None   Collection Time: 09/07/14  5:13 PM  Result Value Ref Range   Specimen Description URINE, CLEAN CATCH    Special Requests NONE    Legionella Antigen, Urine      Negative for Legionella pneumophila serogroup 1                                                              Legionella pneumophila serogroup 1 antigen can be detected in urine within 2 to 3 days of infection and may persist even after treatment. This  assay does not detect other Legionella species or serogroups. Performed at [facility]    Report Status 09/08/2014 FINAL   Strep pneumoniae urinary antigen     Status: None   Collection Time: 09/07/14  5:13 PM  Result Value Ref Range   Strep Pneumo Urinary Antigen NEGATIVE NEGATIVE    Comment:        Infection  due to S. pneumoniae cannot be absolutely ruled out since the antigen present may be below the detection limit of the test. Performed at [facility]   Urine rapid drug screen (hosp performed)     Status: None   Collection Time: 09/07/14  5:14 PM  Result Value Ref Range   Opiates NONE DETECTED NONE DETECTED   Cocaine NONE DETECTED NONE DETECTED   Benzodiazepines NONE DETECTED NONE DETECTED   Amphetamines NONE DETECTED NONE DETECTED   Tetrahydrocannabinol NONE DETECTED NONE DETECTED   Barbiturates NONE DETECTED NONE DETECTED    Comment:        DRUG SCREEN FOR MEDICAL PURPOSES ONLY.  IF CONFIRMATION IS NEEDED FOR ANY PURPOSE, NOTIFY LAB WITHIN 5 DAYS.        LOWEST DETECTABLE LIMITS FOR URINE DRUG SCREEN Drug Class       Cutoff (ng/mL) Amphetamine      1000 Barbiturate      200 Benzodiazepine   967 Tricyclics       893 Opiates          300 Cocaine          300 THC              50   Comprehensive metabolic panel     Status: Abnormal   Collection Time: 09/08/14  4:55 AM  Result Value Ref Range   Sodium 134 (L) 135 - 145 mmol/L    Comment: DELTA CHECK NOTED REPEATED TO VERIFY    Potassium 3.7 3.5 - 5.1 mmol/L   Chloride 102 101 - 111 mmol/L   CO2 24 22 - 32 mmol/L   Glucose, Bld 128 (H) 70 - 99 mg/dL   BUN 9 6 - 20 mg/dL   Creatinine, Ser 0.89 0.61 - 1.24 mg/dL   Calcium 7.8 (L) 8.9 - 10.3 mg/dL   Total Protein 6.2 (L) 6.5 - 8.1 g/dL   Albumin 2.8 (L) 3.5 - 5.0 g/dL   AST 73 (H) 15 - 41 U/L   ALT 60 17 - 63 U/L   Alkaline Phosphatase 126 38 - 126 U/L   Total Bilirubin 0.7 0.3 - 1.2 mg/dL   GFR calc non Af Amer >60 >60 mL/min   GFR calc Af Amer >60 >60 mL/min    Comment: (NOTE) The eGFR has been calculated using the CKD EPI equation. This calculation has not been validated in all clinical situations. eGFR's persistently <90 mL/min signify possible Chronic Kidney Disease.    Anion gap 8 5 - 15  CBC with Differential/Platelet     Status: Abnormal    Collection Time: 09/08/14  4:55 AM  Result Value Ref Range   WBC 6.0 4.0 - 10.5 K/uL   RBC 4.76 4.22 - 5.81 MIL/uL   Hemoglobin 14.4 13.0 - 17.0 g/dL   HCT 40.7 39.0 - 52.0 %   MCV 85.5 78.0 - 100.0 fL   MCH 30.3 26.0 - 34.0 pg   MCHC 35.4 30.0 - 36.0 g/dL   RDW 12.8 11.5 - 15.5 %   Platelets 107 (L) 150 - 400 K/uL    Comment: SPECIMEN CHECKED FOR CLOTS PLATELET COUNT CONFIRMED BY SMEAR    Neutrophils Relative % 43 43 - 77 %   Lymphocytes Relative 43 12 - 46 %   Monocytes Relative 12 3 - 12 %   Eosinophils Relative 1 0 - 5 %   Basophils Relative 1 0 - 1 %   Neutro Abs 2.5 1.7 - 7.7 K/uL   Lymphs Abs 2.6 0.7 - 4.0 K/uL   Monocytes Absolute 0.7 0.1 - 1.0 K/uL   Eosinophils Absolute 0.1 0.0 - 0.7 K/uL   Basophils Absolute 0.1 0.0 - 0.1 K/uL   Smear Review MORPHOLOGY UNREMARKABLE   Procalcitonin     Status: None   Collection Time: 09/09/14  5:05 AM  Result Value Ref Range   Procalcitonin <0.10 ng/mL    Comment:        Interpretation: PCT (Procalcitonin) <= 0.5 ng/mL: Systemic infection (sepsis) is not likely. Local bacterial infection is possible. (NOTE)         ICU PCT Algorithm               Non ICU PCT Algorithm    ----------------------------     ------------------------------         PCT < 0.25 ng/mL                 PCT < 0.1 ng/mL     Stopping of antibiotics            Stopping of antibiotics       strongly encouraged.               strongly encouraged.    ----------------------------     ------------------------------       PCT level decrease  by               PCT < 0.25 ng/mL       >= 80% from peak PCT       OR PCT 0.25 - 0.5 ng/mL          Stopping of antibiotics                                             encouraged.     Stopping of antibiotics           encouraged.    ----------------------------     ------------------------------       PCT level decrease by              PCT >= 0.25 ng/mL       < 80% from peak PCT        AND PCT >= 0.5 ng/mL             Continuin g antibiotics                                              encouraged.       Continuing antibiotics            encouraged.    ----------------------------     ------------------------------     PCT level increase compared          PCT > 0.5 ng/mL         with peak PCT AND          PCT >= 0.5 ng/mL             Escalation of antibiotics                                          strongly encouraged.      Escalation of antibiotics        strongly encouraged.      HPI :44 year old male who states that he has no chronic medical conditions presented with 4 day history of nonproductive cough and subjective fevers, chills, and rigors. He did not have a thermometer to check his temperature. The patient has also been complaining of myalgias and arthralgias with bifrontal headache. He denies any focal extremity weakness or visual disturbance. He denies any neck pain. Finally, the patient has been having loose stools for the past 24 hours. He states that he had 5 watery stools on the day prior to admission without any hematochezia or melena. He has some lower abdominal pain for the past 24 hours. He denies any unusual rashes, synovitis, nausea, vomiting, chest pain, shortness of breath, hemoptysis. He has not seen a physician in 8 years. He is originally from [place] and has lived in [place] for 12 years. He denies any dysuria or hematuria.  In the emergency department, serum sodium was 126 with bicarbonate 21. AST 211, ALT 92, alkaline phosphatase is 164, total bilirubin 1.5. WBC was 6.6 with platelets 120,000. Urinalysis negative for pyuria. CT of the abdomen and pelvis shows bibasilar opacities, right greater than left. There is slight wall thickening of the sigmoid colon with a fluid-filled colon. Urinalysis  is negative for pyuria   HOSPITAL COURSE   Lobar pneumonia patient on Rocephin and azithromycin, now switched to levofloxacin 7 days -Urine Legionella antigen negative -Urine  Streptococcus pneumoniae antigen negative -HIV antibody negative -Lactic acid within normal limits -Procalcitonin negative -Respiratory viral panel pending -LDH mildly elevated, nonspecific -UDS negative  Thrombocytopenia -May be due to the patient's infectious process -HIV antibody negative -serum B12 within normal limits Repeat CBC outpatient   Diarrhea -CT scan suggestive of sigmoiditis ? -C. difficile PCR negative -Stool pathogen panel , pending Patient to continue with Levaquin and Flagyl 7 days   Transaminasemia mild increase in AST , likely nonspecific, improving -likely due to infection Follow-up CMP in one week -CT shows normal GB -Hepatitis B surface antigen  negative -Hepatitis C antibody  negative   Hyperglycemia  -Hemoglobin A1c  8.1 , new diagnosis for him?  Started on glipizide 2.5 mg daily to be titrated by PCP   Hyponatremia, resolved -likely volume depletion vs atypical pneumonia -IVF       Discharge Exam:    Blood pressure 107/57, pulse 73, temperature 97.4 F (36.3 C), temperature source Oral, resp. rate 20, height '5\' 6"'  (1.676 m), weight 84.142 kg (185 lb 8 oz), SpO2 100 %. General: No acute respiratory distress Lungs: Clear to auscultation bilaterally without wheezes or crackles Cardiovascular: Regular rate and rhythm without murmur gallop or rub normal S1 and S2 Abdomen: Nontender, nondistended, soft, bowel sounds positive, no rebound, no ascites, no appreciable mass Extremities: No significant cyanosis, clubbing, or edema bilateral lower extremities          Follow-up Information    Follow up with  PCP in 3-5 days. Schedule an appointment as soon as possible for a visit in 3 days.      SignedReyne Dumas 09/09/2014, 9:28 AM

## 2014-09-09 NOTE — Plan of Care (Signed)
    Bryan Mueller MRN: [id number] DOB/AGE: 42/05/1970 42 y.o.  Patient admitted 09/07/14 , discharged 09/09/14, patient can go to work on 09/12/14 Please call if any questions   [name] [phone]

## 2014-09-10 LAB — GI PATHOGEN PANEL BY PCR, STOOL
C difficile toxin A/B: NOT DETECTED
CAMPYLOBACTER BY PCR: NOT DETECTED
CRYPTOSPORIDIUM BY PCR: NOT DETECTED
E COLI (ETEC) LT/ST: NOT DETECTED
E COLI 0157 BY PCR: NOT DETECTED
E coli (STEC): NOT DETECTED
Norovirus GI/GII: NOT DETECTED
Rotavirus A by PCR: NOT DETECTED
Salmonella by PCR: NOT DETECTED
Shigella by PCR: NOT DETECTED

## 2014-09-13 LAB — CULTURE, BLOOD (ROUTINE X 2)
CULTURE: NO GROWTH
Culture: NO GROWTH

## 2014-09-15 ENCOUNTER — Inpatient Hospital Stay: Payer: Self-pay | Admitting: Family Medicine

## 2016-06-02 IMAGING — CT CT ABD-PELV W/ CM
2 of 5 series · 16 of 46 positions shown, 18 images · IV contrast (OMNIPAQUE 300)
Comparison: None.

CLINICAL DATA: Acute onset of fever, body aches and diarrhea.
Nausea. Initial encounter.

EXAM:
CT ABDOMEN AND PELVIS WITH CONTRAST
TECHNIQUE: Multidetector CT imaging of the abdomen and pelvis was performed
using the standard protocol following bolus administration of
intravenous contrast.
CONTRAST:  100mL OMNIPAQUE IOHEXOL 300 MG/ML  SOLN

[Series 2: abd/pel with · axial · 0.76mm/px · z∈[-491,-66]mm · 13 of 95 slices shown, 15 images]
[im 5/95  soft-tissue]
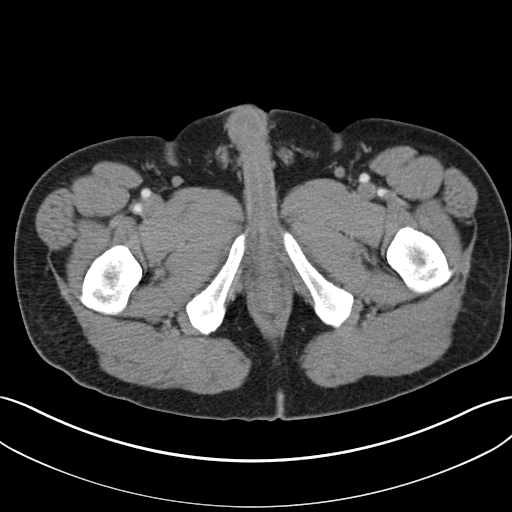
[im 5/95  bone]
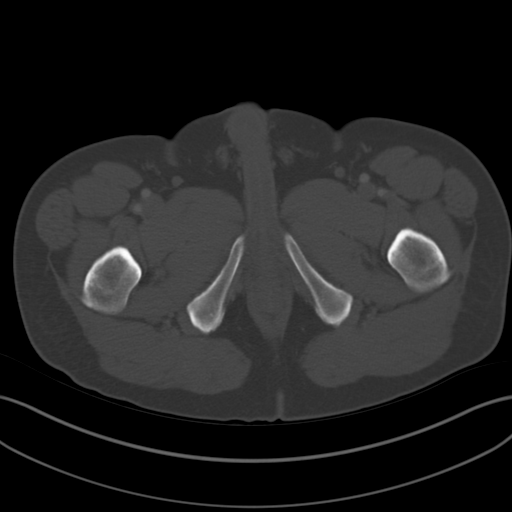
[im 15/95  soft-tissue]
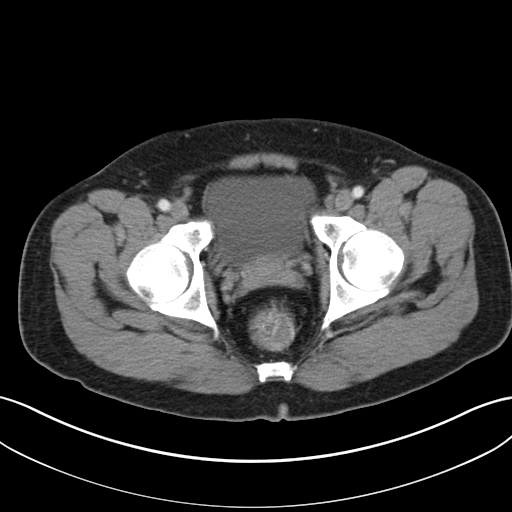
[im 20/95  soft-tissue]
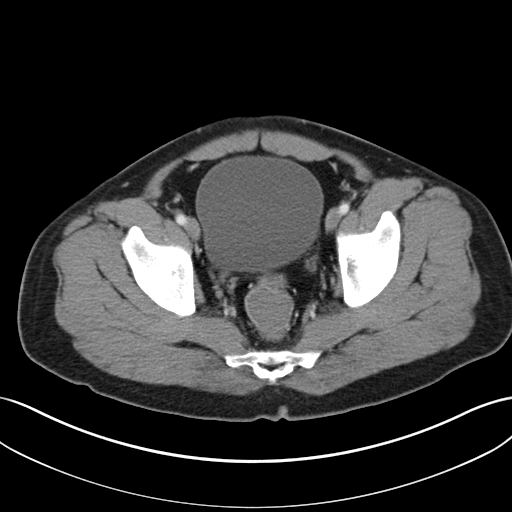
[im 25/95  soft-tissue]
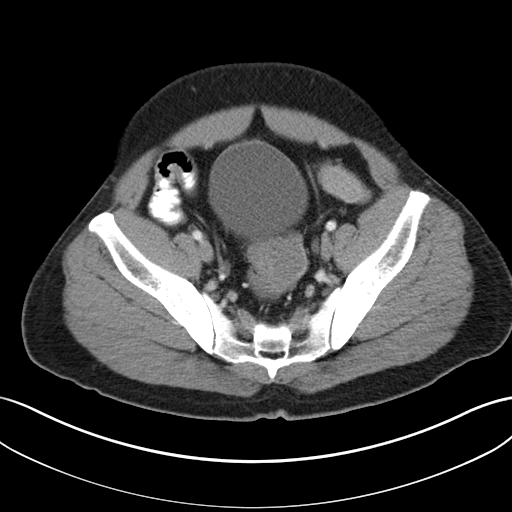
[im 35/95  soft-tissue]
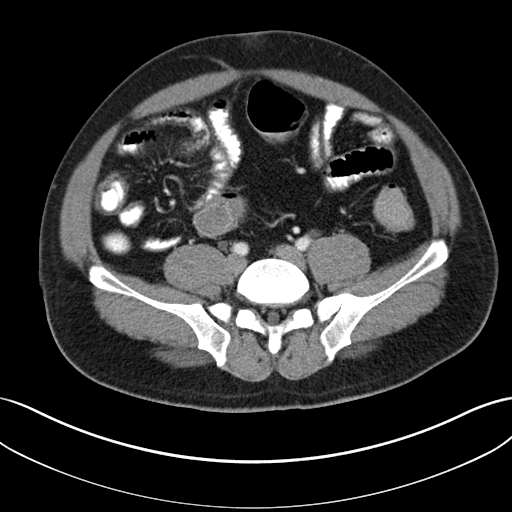
[im 40/95  soft-tissue]
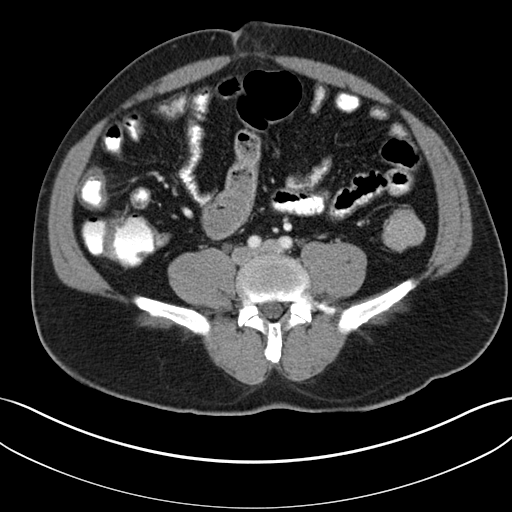
[im 50/95  soft-tissue]
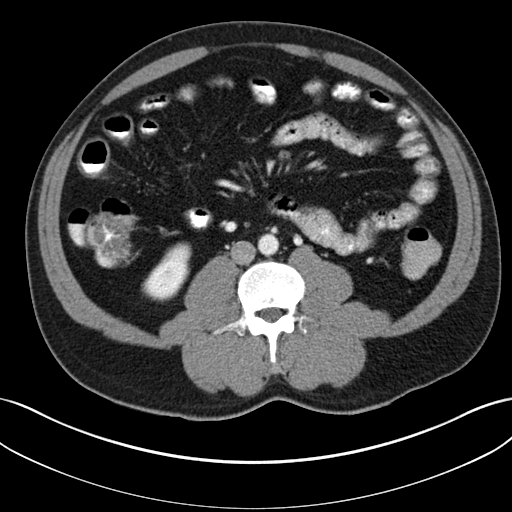
[im 55/95  soft-tissue]
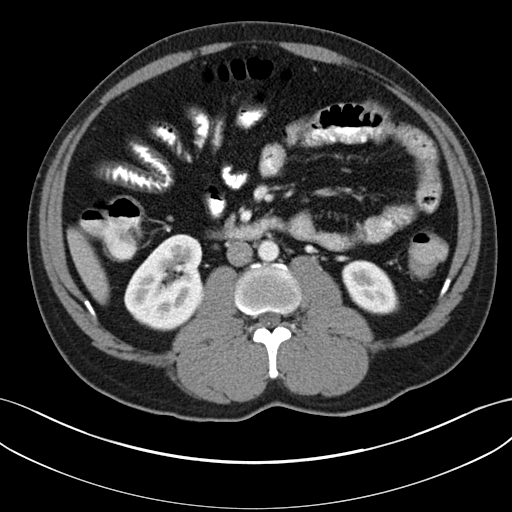
[im 60/95  soft-tissue]
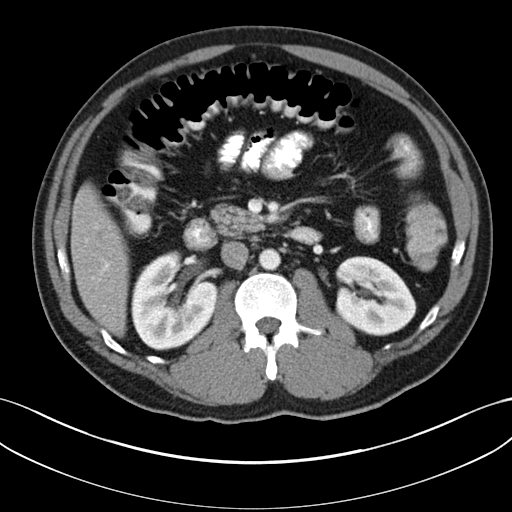
[im 60/95  bone]
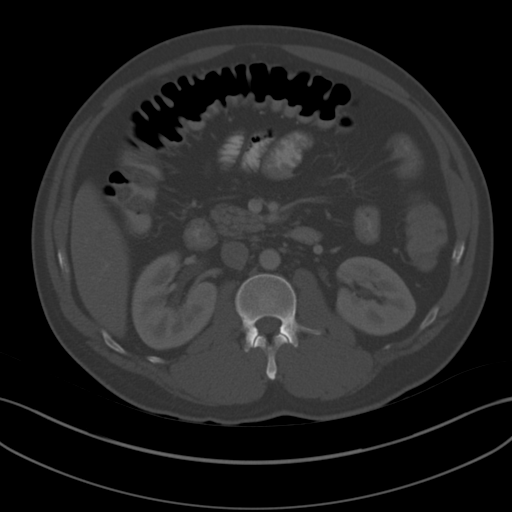
[im 70/95  soft-tissue]
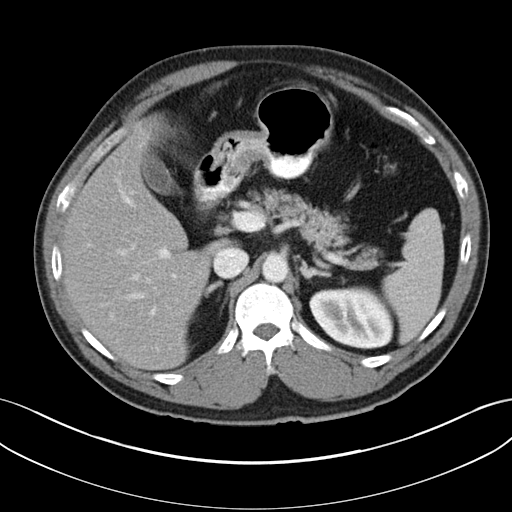
[im 75/95  soft-tissue]
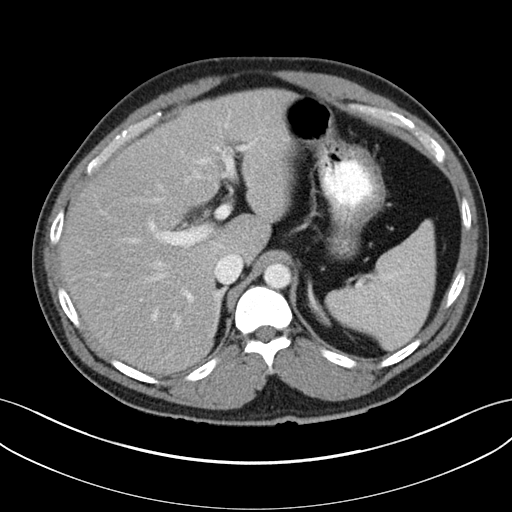
[im 80/95  soft-tissue]
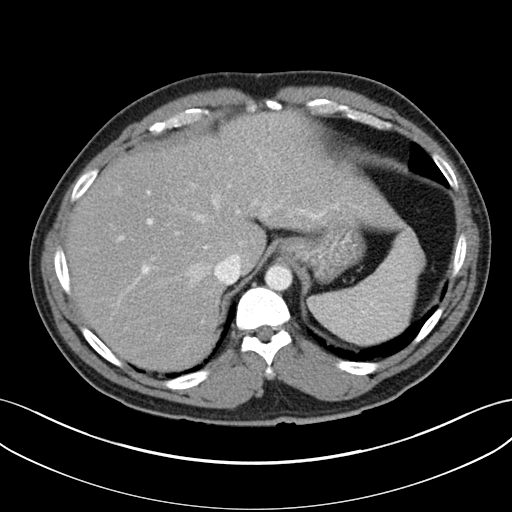
[im 90/95  soft-tissue]
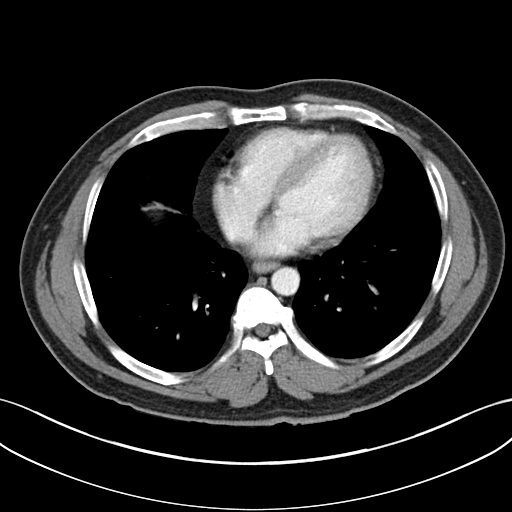

[Series 5: coronal a/|p · coronal · 0.74mm/px · 3 of 99 slices shown]
[im 33/99  soft-tissue]
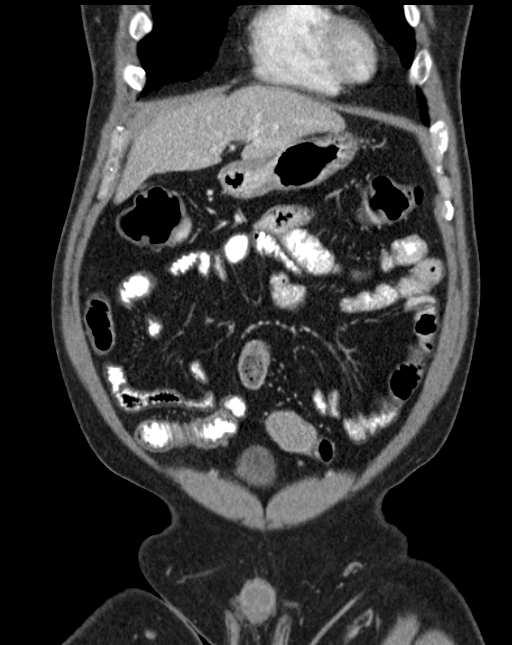
[im 44/99  soft-tissue]
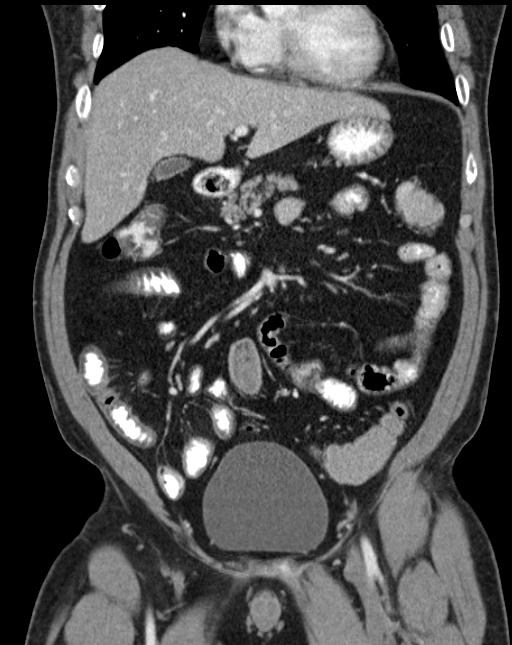
[im 55/99  soft-tissue]
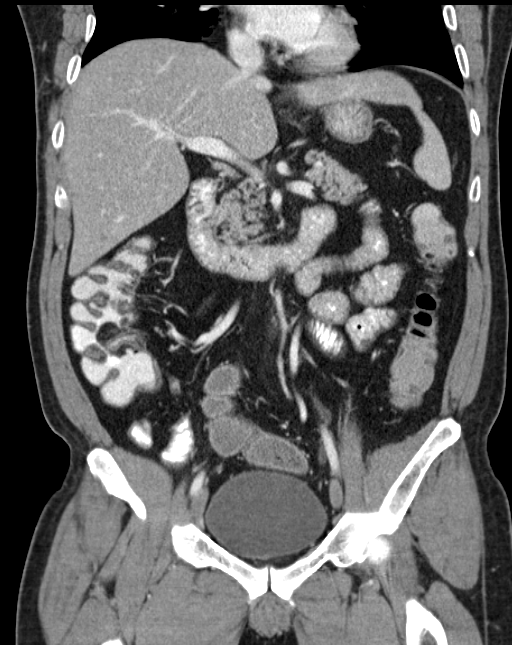

[16 of 46 positions shown; findings below may reference images not displayed]

FINDINGS: Bibasilar airspace opacities are compatible with pneumonia, worse on
the right.

The liver and spleen are unremarkable in appearance. The gallbladder
is within normal limits. The pancreas and adrenal glands are
unremarkable.

The kidneys are unremarkable in appearance. There is no evidence of
hydronephrosis. No renal or ureteral stones are seen. No perinephric
stranding is appreciated.

No free fluid is identified. The small bowel is unremarkable in
appearance. The stomach is within normal limits. No acute vascular
abnormalities are seen.

A small umbilical hernia is noted, containing only fat.

The appendix is somewhat prominent, measuring up to 9 mm in
diameter, but there is no associated soft tissue inflammation to
suggest appendicitis. Contrast progresses to the level of the
descending colon. The colon is largely filled with fluid. There is
slightly increased wall enhancement along the sigmoid colon, which
may reflect a mild infectious process, given the patient's symptoms.

The bladder is mildly distended and grossly unremarkable. The
prostate remains normal in size. No inguinal lymphadenopathy is
seen.

No acute osseous abnormalities are identified.
IMPRESSION: 1. Bibasilar airspace opacities, compatible with pneumonia, worse on
the right.
2. Slightly increased wall enhancement along the sigmoid colon may
reflect a mild infectious process, given the patient's symptoms. The
colon is largely filled with fluid.
3. Mild prominence of the appendix is thought to remain within
normal limits, given the lack of soft tissue inflammation.
4. Small umbilical hernia, containing only fat.

## 2017-06-03 ENCOUNTER — Emergency Department (HOSPITAL_COMMUNITY)
Admission: EM | Admit: 2017-06-03 | Discharge: 2017-06-03 | Disposition: A | Discharge status: Home / Self Care | Payer: Self-pay | Attending: Emergency Medicine | Admitting: Emergency Medicine

## 2017-06-03 ENCOUNTER — Encounter (HOSPITAL_COMMUNITY): Payer: Self-pay | Admitting: Emergency Medicine

## 2017-06-03 DIAGNOSIS — L0291 Cutaneous abscess, unspecified: Secondary | ICD-10-CM

## 2017-06-03 DIAGNOSIS — H6500 Acute serous otitis media, unspecified ear: Secondary | ICD-10-CM

## 2017-06-03 DIAGNOSIS — Z79899 Other long term (current) drug therapy: Secondary | ICD-10-CM | POA: Insufficient documentation

## 2017-06-03 DIAGNOSIS — M65052 Abscess of tendon sheath, left thigh: Secondary | ICD-10-CM | POA: Insufficient documentation

## 2017-06-03 DIAGNOSIS — Z7984 Long term (current) use of oral hypoglycemic drugs: Secondary | ICD-10-CM | POA: Insufficient documentation

## 2017-06-03 DIAGNOSIS — H6501 Acute serous otitis media, right ear: Secondary | ICD-10-CM | POA: Insufficient documentation

## 2017-06-03 DIAGNOSIS — F17220 Nicotine dependence, chewing tobacco, uncomplicated: Secondary | ICD-10-CM | POA: Insufficient documentation

## 2017-06-03 MED ORDER — LIDOCAINE-EPINEPHRINE (PF) 2 %-1:200000 IJ SOLN
20.0000 mL | Freq: Once | INTRAMUSCULAR | Status: AC
  Administered 2017-06-03: 20 mL
  Filled 2017-06-03: qty 20

## 2017-06-03 MED ORDER — CEPHALEXIN 500 MG PO CAPS: 500.0000 mg | ORAL_CAPSULE | Freq: Four times a day (QID) | ORAL | 0 refills | Status: DC

## 2017-06-03 MED ORDER — DOXYCYCLINE HYCLATE 100 MG PO CAPS: 100.0000 mg | ORAL_CAPSULE | Freq: Two times a day (BID) | ORAL | 0 refills | Status: DC

## 2017-06-03 NOTE — ED Provider Notes (Signed)
..  Incision and Drainage Date/Time: 06/03/2017 9:26 AM Performed by: Khari Lett, Chase PicketJaime Pilcher, PA-C Authorized by: Sherron Mummert, Chase PicketJaime Pilcher, PA-C   Consent:    Consent obtained:  Verbal   Consent given by:  Patient   Risks discussed:  Bleeding, incomplete drainage and pain Location:    Type:  Abscess   Location:  Lower extremity   Lower extremity location:  Leg   Leg location:  L upper leg Pre-procedure details:    Skin preparation:  Betadine Anesthesia (see MAR for exact dosages):    Anesthesia method:  Local infiltration   Local anesthetic:  Lidocaine 2% WITH epi Procedure type:    Complexity:  Complex Procedure details:    Incision types:  Single straight   Scalpel blade:  11   Wound management:  Probed and deloculated   Drainage:  Purulent   Drainage amount:  Moderate   Packing materials:  1/4 in iodoform gauze Post-procedure details:    Patient tolerance of procedure:  Tolerated well, no immediate complications      Bryan Mueller, Chase PicketJaime Pilcher, PA-C 06/03/17 [id number]    Lorre NickAllen, Anthony, MD 06/04/17 [id number]

## 2017-06-03 NOTE — ED Triage Notes (Signed)
Patient c/o right ear pain x 4 days.  Also c/o "knot" on left thigh x 3 days. reports been putting lotion on it.

## 2017-06-03 NOTE — ED Provider Notes (Signed)
[facility]-EMERGENCY DEPT Provider Note   CSN: [id number] Arrival date & time: 06/03/17  [id number]     History   Chief Complaint Chief Complaint  Patient presents with  . Otalgia  . Abscess    HPI Bryan Mueller is a 47 y.o. male.  47 year old male presents with 3 days of right-sided ear pain along with left lateral thigh pain.  States that the ear pain is been sharp and not associate with hearing loss or tinnitus.  No drainage from the ear.  Has had some URI symptoms.  Has another complaint of left lateral thigh swelling and erythema with drainage from an abscess.  No fever or chills.  Has not been using any treatment for that prior to arrival.  Nothing makes his symptoms better.      History reviewed. No pertinent past medical history.  Patient Active Problem List   Diagnosis Date Noted  . Lobar pneumonia (HCC) 09/07/2014  . Hyponatremia 09/07/2014  . Thrombocytopenia (HCC) 09/07/2014  . Transaminasemia 09/07/2014    History reviewed. No pertinent surgical history.     Home Medications    Prior to Admission medications   Medication Sig Start Date End Date Taking? Authorizing Provider  benzonatate (TESSALON) 100 MG capsule Take 1 capsule (100 mg total) by mouth 2 (two) times daily as needed for cough. 09/09/14   Richarda OverlieAbrol, Nayana, MD  glipiZIDE (GLUCOTROL) 5 MG tablet Take 0.5 tablets (2.5 mg total) by mouth daily before breakfast. 09/09/14   Richarda OverlieAbrol, Nayana, MD  guaifenesin (ROBITUSSIN) 100 MG/5ML syrup Take 200 mg by mouth 3 (three) times daily as needed for cough.    [provider]  levofloxacin (LEVAQUIN) 500 MG tablet Take 1 tablet (500 mg total) by mouth daily. 09/09/14   Richarda OverlieAbrol, Nayana, MD  metroNIDAZOLE (FLAGYL) 500 MG tablet Take 1 tablet (500 mg total) by mouth 3 (three) times daily. 09/09/14   Richarda OverlieAbrol, Nayana, MD  pseudoephedrine (SUDAFED) 30 MG tablet Take 30 mg by mouth every 4 (four) hours as needed for congestion.    [provider]      Family History No family history on file.  Social History Social History   Tobacco Use  . Smoking status: Never Smoker  . Smokeless tobacco: Current User    Types: Chew  Substance Use Topics  . Alcohol use: Yes  . Drug use: No     Allergies   Patient has no known allergies.   Review of Systems Review of Systems  All other systems reviewed and are negative.    Physical Exam Updated Vital Signs BP 135/82 (BP Location: Left Arm)   Pulse 94   Temp 98.5 F (36.9 C) (Oral)   Resp 18   SpO2 99%   Physical Exam  Constitutional: He is oriented to person, place, and time. He appears well-developed and well-nourished.  Non-toxic appearance. No distress.  HENT:  Head: Normocephalic and atraumatic.  Right Ear: No mastoid tenderness. Tympanic membrane is injected and erythematous. A middle ear effusion is present. No hemotympanum.  Eyes: Conjunctivae, EOM and lids are normal. Pupils are equal, round, and reactive to light.  Neck: Normal range of motion. Neck supple. No tracheal deviation present. No thyroid mass present.  Cardiovascular: Normal rate, regular rhythm and normal heart sounds. Exam reveals no gallop.  No murmur heard. Pulmonary/Chest: Effort normal and breath sounds normal. No stridor. No respiratory distress. He has no decreased breath sounds. He has no wheezes. He has no rhonchi. He has no rales.  Abdominal: Soft. Normal appearance and bowel sounds are normal. He exhibits no distension. There is no tenderness. There is no rebound and no CVA tenderness.  Musculoskeletal: Normal range of motion. He exhibits no edema or tenderness.       Legs: Neurological: He is alert and oriented to person, place, and time. He has normal strength. No cranial nerve deficit or sensory deficit. GCS eye subscore is 4. GCS verbal subscore is 5. GCS motor subscore is 6.  Skin: Skin is warm and dry. No abrasion and no rash noted.  Psychiatric: He has a normal mood and affect. His  speech is normal and behavior is normal.  Nursing note and vitals reviewed.    ED Treatments / Results  Labs (all labs ordered are listed, but only abnormal results are displayed) Labs Reviewed - No data to display  EKG  EKG Interpretation None       Radiology No results found.  Procedures Procedures (including critical care time)  Medications Ordered in ED Medications - No data to display   Initial Impression / Assessment and Plan / ED Course  I have reviewed the triage vital signs and the nursing notes.  Pertinent labs & imaging results that were available during my care of the patient were reviewed by me and considered in my medical decision making (see chart for details).     Abscess drained by physician assistant.  Will place on antibiotics for surrounding cellulitis as well as treat for right otitis media  Final Clinical Impressions(s) / ED Diagnoses   Final diagnoses:  None    ED Discharge Orders    None       Lorre Nick, MD 06/03/17 [phone]

## 2018-06-02 ENCOUNTER — Emergency Department (HOSPITAL_COMMUNITY): Payer: Self-pay

## 2018-06-02 ENCOUNTER — Other Ambulatory Visit: Payer: Self-pay

## 2018-06-02 ENCOUNTER — Encounter (HOSPITAL_COMMUNITY): Payer: Self-pay | Admitting: *Deleted

## 2018-06-02 ENCOUNTER — Emergency Department (HOSPITAL_COMMUNITY)
Admission: EM | Admit: 2018-06-02 | Discharge: 2018-06-03 | Disposition: A | Discharge status: Home / Self Care | Payer: Self-pay | Attending: Emergency Medicine | Admitting: Emergency Medicine

## 2018-06-02 DIAGNOSIS — R739 Hyperglycemia, unspecified: Secondary | ICD-10-CM | POA: Insufficient documentation

## 2018-06-02 DIAGNOSIS — R000 Tachycardia, unspecified: Secondary | ICD-10-CM | POA: Insufficient documentation

## 2018-06-02 DIAGNOSIS — L02415 Cutaneous abscess of right lower limb: Secondary | ICD-10-CM | POA: Insufficient documentation

## 2018-06-02 DIAGNOSIS — Z7984 Long term (current) use of oral hypoglycemic drugs: Secondary | ICD-10-CM | POA: Insufficient documentation

## 2018-06-02 DIAGNOSIS — F17220 Nicotine dependence, chewing tobacco, uncomplicated: Secondary | ICD-10-CM | POA: Insufficient documentation

## 2018-06-02 DIAGNOSIS — Z23 Encounter for immunization: Secondary | ICD-10-CM | POA: Insufficient documentation

## 2018-06-02 DIAGNOSIS — L03115 Cellulitis of right lower limb: Secondary | ICD-10-CM | POA: Insufficient documentation

## 2018-06-02 DIAGNOSIS — Z79899 Other long term (current) drug therapy: Secondary | ICD-10-CM | POA: Insufficient documentation

## 2018-06-02 LAB — CBC WITH DIFFERENTIAL/PLATELET
ABS IMMATURE GRANULOCYTES: 0.17 10*3/uL — AB (ref 0.00–0.07)
BASOS ABS: 0.1 10*3/uL (ref 0.0–0.1)
Basophils Relative: 1 %
Eosinophils Absolute: 0.2 10*3/uL (ref 0.0–0.5)
Eosinophils Relative: 1 %
HCT: 47.8 % (ref 39.0–52.0)
Hemoglobin: 16.6 g/dL (ref 13.0–17.0)
Immature Granulocytes: 1 %
LYMPHS PCT: 19 %
Lymphs Abs: 2.7 10*3/uL (ref 0.7–4.0)
MCH: 30.7 pg (ref 26.0–34.0)
MCHC: 34.7 g/dL (ref 30.0–36.0)
MCV: 88.4 fL (ref 80.0–100.0)
Monocytes Absolute: 0.9 10*3/uL (ref 0.1–1.0)
Monocytes Relative: 6 %
NEUTROS ABS: 10.2 10*3/uL — AB (ref 1.7–7.7)
NRBC: 0 % (ref 0.0–0.2)
Neutrophils Relative %: 72 %
Platelets: 336 10*3/uL (ref 150–400)
RBC: 5.41 MIL/uL (ref 4.22–5.81)
RDW: 11.9 % (ref 11.5–15.5)
WBC: 14.3 10*3/uL — AB (ref 4.0–10.5)

## 2018-06-02 LAB — BASIC METABOLIC PANEL
Anion gap: 12 (ref 5–15)
BUN: 14 mg/dL (ref 6–20)
CO2: 27 mmol/L (ref 22–32)
Calcium: 9.5 mg/dL (ref 8.9–10.3)
Chloride: 93 mmol/L — ABNORMAL LOW (ref 98–111)
Creatinine, Ser: 0.75 mg/dL (ref 0.61–1.24)
GFR calc Af Amer: >60 mL/min (ref >60)
Glucose, Bld: 452 mg/dL — ABNORMAL HIGH (ref 70–99)
Potassium: 4 mmol/L (ref 3.5–5.1)
Sodium: 132 mmol/L — ABNORMAL LOW (ref 135–145)

## 2018-06-02 LAB — LACTIC ACID, PLASMA: LACTIC ACID, VENOUS: 2.4 mmol/L — AB (ref 0.5–1.9)

## 2018-06-02 LAB — CBG MONITORING, ED: Glucose-Capillary: 394 mg/dL — ABNORMAL HIGH (ref 70–99)

## 2018-06-02 MED ORDER — TETANUS-DIPHTH-ACELL PERTUSSIS 5-2.5-18.5 LF-MCG/0.5 IM SUSP
0.5000 mL | Freq: Once | INTRAMUSCULAR | Status: AC
  Administered 2018-06-02: 0.5 mL via INTRAMUSCULAR
  Filled 2018-06-02: qty 0.5

## 2018-06-02 MED ORDER — CLINDAMYCIN PHOSPHATE 600 MG/50ML IV SOLN
600.0000 mg | Freq: Once | INTRAVENOUS | Status: AC
  Administered 2018-06-02: 600 mg via INTRAVENOUS
  Filled 2018-06-02: qty 50

## 2018-06-02 MED ORDER — LIDOCAINE-EPINEPHRINE 2 %-1:100000 IJ SOLN
20.0000 mL | Freq: Once | INTRAMUSCULAR | Status: DC
  Filled 2018-06-02: qty 20

## 2018-06-02 MED ORDER — LIDOCAINE-EPINEPHRINE (PF) 2 %-1:200000 IJ SOLN
INTRAMUSCULAR | Status: AC
  Administered 2018-06-02: 20 mL
  Filled 2018-06-02: qty 20

## 2018-06-02 MED ORDER — MORPHINE SULFATE (PF) 4 MG/ML IV SOLN
4.0000 mg | Freq: Once | INTRAVENOUS | Status: AC
  Administered 2018-06-02: 4 mg via INTRAVENOUS
  Filled 2018-06-02: qty 1

## 2018-06-02 MED ORDER — INSULIN ASPART 100 UNIT/ML ~~LOC~~ SOLN
10.0000 [IU] | Freq: Once | SUBCUTANEOUS | Status: AC
Start: 2018-06-02 — End: 2018-06-03
  Administered 2018-06-03: 10 [IU] via SUBCUTANEOUS
  Filled 2018-06-02: qty 1

## 2018-06-02 MED ORDER — SODIUM CHLORIDE 0.9 % IV BOLUS
1000.0000 mL | Freq: Once | INTRAVENOUS | Status: AC
  Administered 2018-06-02: 1000 mL via INTRAVENOUS

## 2018-06-02 MED ORDER — LIDOCAINE-EPINEPHRINE (PF) 2 %-1:200000 IJ SOLN
20.0000 mL | Freq: Once | INTRAMUSCULAR | Status: AC
  Administered 2018-06-02: 20 mL

## 2018-06-02 NOTE — ED Provider Notes (Signed)
[facility]-EMERGENCY DEPT Provider Note   CSN: [id number] Arrival date & time: 06/02/18  1911     History   Chief Complaint Chief Complaint  Patient presents with  . Rash    bilat LE's x 5 years  . Wound Infection    HPI Bryan Mueller is a 48 y.o. male.  The history is provided by the patient. No language interpreter was used.  Rash     48 year old male presenting for evaluation of skin infection.  Patient report he has had eczema involving his lower extremity for the past 3 to 4 years.  However for the past 3 days he noticed an area of induration that is tender and oozing of fluid involving the back of his right calf.  Pain is moderate in severity, nonradiating.  He does endorse some subjective fever and chills.  He does not complain of any nausea vomiting, chest pain shortness of breath abdominal pain back pain or numbness.  He denies any specific injury.  He cannot recall his tetanus status.  His wife has been applying dressing to help protect the wound.  He denies any recent tick bite.  No recent travel.  History reviewed. No pertinent past medical history.  Patient Active Problem List   Diagnosis Date Noted  . Lobar pneumonia (HCC) 09/07/2014  . Hyponatremia 09/07/2014  . Thrombocytopenia (HCC) 09/07/2014  . Transaminasemia 09/07/2014    History reviewed. No pertinent surgical history.      Home Medications    Prior to Admission medications   Medication Sig Start Date End Date Taking? Authorizing Provider  benzonatate (TESSALON) 100 MG capsule Take 1 capsule (100 mg total) by mouth 2 (two) times daily as needed for cough. 09/09/14   Richarda OverlieAbrol, Nayana, MD  cephALEXin (KEFLEX) 500 MG capsule Take 1 capsule (500 mg total) by mouth 4 (four) times daily. 06/03/17   Lorre NickAllen, Anthony, MD  doxycycline (VIBRAMYCIN) 100 MG capsule Take 1 capsule (100 mg total) by mouth 2 (two) times daily. 06/03/17   Lorre NickAllen, Anthony, MD  glipiZIDE (GLUCOTROL) 5 MG tablet Take  0.5 tablets (2.5 mg total) by mouth daily before breakfast. 09/09/14   Richarda OverlieAbrol, Nayana, MD  guaifenesin (ROBITUSSIN) 100 MG/5ML syrup Take 200 mg by mouth 3 (three) times daily as needed for cough.    [provider]  levofloxacin (LEVAQUIN) 500 MG tablet Take 1 tablet (500 mg total) by mouth daily. 09/09/14   Richarda OverlieAbrol, Nayana, MD  metroNIDAZOLE (FLAGYL) 500 MG tablet Take 1 tablet (500 mg total) by mouth 3 (three) times daily. 09/09/14   Richarda OverlieAbrol, Nayana, MD  pseudoephedrine (SUDAFED) 30 MG tablet Take 30 mg by mouth every 4 (four) hours as needed for congestion.    [provider]    Family History No family history on file.  Social History Social History   Tobacco Use  . Smoking status: Never Smoker  . Smokeless tobacco: Current User    Types: Chew  Substance Use Topics  . Alcohol use: Not Currently  . Drug use: No     Allergies   Patient has no known allergies.   Review of Systems Review of Systems  Skin: Positive for rash.  All other systems reviewed and are negative.    Physical Exam Updated Vital Signs BP (!) 139/94 (BP Location: Right Arm)   Pulse (!) 115   Temp 97.9 F (36.6 C) (Oral)   Resp 16   Ht 5\' 6"  (1.676 m)   Wt 72 kg   SpO2  100%   BMI 25.62 kg/m   Physical Exam Vitals signs and nursing note reviewed.  Constitutional:      General: He is not in acute distress.    Appearance: He is well-developed.  HENT:     Head: Atraumatic.  Eyes:     Conjunctiva/sclera: Conjunctivae normal.  Neck:     Musculoskeletal: Neck supple.  Cardiovascular:     Rate and Rhythm: Tachycardia present.  Pulmonary:     Effort: Pulmonary effort is normal.     Breath sounds: Normal breath sounds.  Abdominal:     Palpations: Abdomen is soft.     Tenderness: There is no abdominal tenderness.  Skin:    Comments: Right posterior calf: An area of induration and fluctuant oozing out purulent discharge approximately 4 cm in diameter with surrounding skin erythema.  It  is tender to palpation.  Calf compartment is soft, pedal pulse palpable.  Neurological:     Mental Status: He is alert.      ED Treatments / Results  Labs (all labs ordered are listed, but only abnormal results are displayed) Labs Reviewed  BASIC METABOLIC PANEL - Abnormal; Notable for the following components:      Result Value   Sodium 132 (*)    Chloride 93 (*)    Glucose, Bld 452 (*)    All other components within normal limits  CBC WITH DIFFERENTIAL/PLATELET - Abnormal; Notable for the following components:   WBC 14.3 (*)    Neutro Abs 10.2 (*)    Abs Immature Granulocytes 0.17 (*)    All other components within normal limits  LACTIC ACID, PLASMA - Abnormal; Notable for the following components:   Lactic Acid, Venous 2.4 (*)    All other components within normal limits  LACTIC ACID, PLASMA - Abnormal; Notable for the following components:   Lactic Acid, Venous 2.2 (*)    All other components within normal limits  CBG MONITORING, ED - Abnormal; Notable for the following components:   Glucose-Capillary 394 (*)    All other components within normal limits  CBG MONITORING, ED - Abnormal; Notable for the following components:   Glucose-Capillary 306 (*)    All other components within normal limits    EKG None  Radiology Dg Tibia/fibula Right  Result Date: 06/02/2018 CLINICAL DATA:  Draining wound posterior tibia and fibula at midshaft. Question osteomyelitis. EXAM: RIGHT TIBIA AND FIBULA - 2 VIEW COMPARISON:  None. FINDINGS: Soft tissue induration of the right leg consistent with cellulitis with small focus of subcutaneous emphysema along the posterolateral aspect of the calf. The tibia and fibula are intact without cortical bone destruction to suggest osteomyelitis. No periosteal reaction/new bone formation is seen. The included ankle, subtalar and knee joints are intact. No acute osseous abnormality. IMPRESSION: Cellulitis of the right leg with focal soft tissue  wound/subcutaneous emphysema along the posterolateral calf. No radiographic evidence of osteomyelitis. Electronically Signed   By: Tollie Ethavid  Kwon M.D.   On: 06/02/2018 23:02    Procedures .Marland Kitchen.Incision and Drainage Date/Time: 06/03/2018 3:13 PM Performed by: Fayrene Helperran, Adasia Hoar, PA-C Authorized by: Fayrene Helperran, Kenyetta Wimbish, PA-C   Consent:    Consent obtained:  Verbal   Consent given by:  Patient   Risks discussed:  Bleeding, incomplete drainage, pain and damage to other organs   Alternatives discussed:  No treatment Universal protocol:    Procedure explained and questions answered to patient or proxy's satisfaction: yes     Relevant documents present and verified: yes  Test results available and properly labeled: yes     Imaging studies available: yes     Required blood products, implants, devices, and special equipment available: yes     Site/side marked: yes     Immediately prior to procedure a time out was called: yes     Patient identity confirmed:  Verbally with patient Location:    Type:  Abscess   Size:  4cm   Location:  Lower extremity   Lower extremity location:  Leg   Leg location:  R lower leg Pre-procedure details:    Skin preparation:  Betadine Anesthesia (see MAR for exact dosages):    Anesthesia method:  Local infiltration   Local anesthetic:  Lidocaine 1% WITH epi Procedure type:    Complexity:  Complex Procedure details:    Incision types:  Single straight   Incision depth:  Subcutaneous   Scalpel blade:  11   Wound management:  Probed and deloculated, irrigated with saline and extensive cleaning   Drainage:  Purulent   Drainage amount:  Copious   Packing materials:  1/4 in gauze Post-procedure details:    Patient tolerance of procedure:  Tolerated well, no immediate complications   (including critical care time)  Medications Ordered in ED Medications  clindamycin (CLEOCIN) IVPB 600 mg (0 mg Intravenous Stopped 06/02/18 2202)  sodium chloride 0.9 % bolus 1,000 mL (0 mLs  Intravenous Stopped 06/02/18 2203)  Tdap (BOOSTRIX) injection 0.5 mL (0.5 mLs Intramuscular Given 06/02/18 2120)  morphine 4 MG/ML injection 4 mg (4 mg Intravenous Given 06/02/18 2133)  lidocaine-EPINEPHrine (XYLOCAINE W/EPI) 2 %-1:200000 (PF) injection 20 mL (20 mLs Infiltration Given 06/02/18 2139)  insulin aspart (novoLOG) injection 10 Units (10 Units Subcutaneous Given 06/03/18 0113)     Initial Impression / Assessment and Plan / ED Course  I have reviewed the triage vital signs and the nursing notes.  Pertinent labs & imaging results that were available during my care of the patient were reviewed by me and considered in my medical decision making (see chart for details).     BP 110/67   Pulse 100   Temp 97.9 F (36.6 C) (Oral)   Resp (!) 22   Ht 5\' 6"  (1.676 m)   Wt 72 kg   SpO2 98%   BMI 25.62 kg/m    Final Clinical Impressions(s) / ED Diagnoses   Final diagnoses:  Cellulitis and abscess of right leg  Hyperglycemia    ED Discharge Orders         Ordered    clindamycin (CLEOCIN) 300 MG capsule  4 times daily     06/03/18 0039    metFORMIN (GLUCOPHAGE) 500 MG tablet  2 times daily with meals     06/03/18 0039    oxyCODONE-acetaminophen (PERCOCET) 5-325 MG tablet  Every 4 hours PRN     06/03/18 0039         9:09 PM Patient with history of eczema here with an abscess involving the posterior aspect of his right lower extremity actively oozing purulent discharge.  Plan to perform incision and drainage, initiate antibiotic.  Patient is tachycardic however afebrile.  Successful I&D.  Packing placed.  Wound care instruction given, abx and pain medication prescribed.  Pt was hyperglycemic, pt given insulin and IVF with some improvement of his sxs.  Recommend wound recheck in 48 hrs.    Fayrene Helper, PA-C 06/03/18 1515    Benjiman Core, MD 06/03/18 2257

## 2018-06-02 NOTE — ED Triage Notes (Addendum)
Pt stated "I've had this rash for 3 years."    Pt presents with discoloration, redness and small pustules to bilat LE's also with open, draining wound to right posterior LE x 3 days.

## 2018-06-03 LAB — CBG MONITORING, ED: Glucose-Capillary: 306 mg/dL — ABNORMAL HIGH (ref 70–99)

## 2018-06-03 LAB — LACTIC ACID, PLASMA: Lactic Acid, Venous: 2.2 mmol/L (ref 0.5–1.9)

## 2018-06-03 MED ORDER — CLINDAMYCIN HCL 300 MG PO CAPS: 300.0000 mg | ORAL_CAPSULE | Freq: Four times a day (QID) | ORAL | 0 refills | Status: DC

## 2018-06-03 MED ORDER — METFORMIN HCL 500 MG PO TABS: 500.0000 mg | ORAL_TABLET | Freq: Two times a day (BID) | ORAL | 0 refills | Status: DC

## 2018-06-03 MED ORDER — OXYCODONE-ACETAMINOPHEN 5-325 MG PO TABS: 2.0000 | ORAL_TABLET | ORAL | 0 refills | Status: DC | PRN

## 2018-06-03 NOTE — Discharge Instructions (Signed)
You have a skin abscess that was drained.  Please return in 2 days for removal of your packing.  Take antibiotic as prescribed.  Your blood sugar is high today, it will need to be recheck.  Take metformin as prescribed.  Return sooner if you have any concerns.

## 2021-12-14 ENCOUNTER — Emergency Department (HOSPITAL_COMMUNITY): Payer: Self-pay

## 2021-12-14 ENCOUNTER — Inpatient Hospital Stay (HOSPITAL_COMMUNITY)
Admission: EM | Admit: 2021-12-14 | Discharge: 2021-12-18 | DRG: 872 | Disposition: A | Discharge status: Home / Self Care | Payer: Self-pay | Attending: Internal Medicine | Admitting: Internal Medicine

## 2021-12-14 DIAGNOSIS — R030 Elevated blood-pressure reading, without diagnosis of hypertension: Secondary | ICD-10-CM | POA: Diagnosis present

## 2021-12-14 DIAGNOSIS — E871 Hypo-osmolality and hyponatremia: Secondary | ICD-10-CM | POA: Diagnosis present

## 2021-12-14 DIAGNOSIS — D72829 Elevated white blood cell count, unspecified: Secondary | ICD-10-CM | POA: Diagnosis present

## 2021-12-14 DIAGNOSIS — F17220 Nicotine dependence, chewing tobacco, uncomplicated: Secondary | ICD-10-CM | POA: Diagnosis present

## 2021-12-14 DIAGNOSIS — Z91199 Patient's noncompliance with other medical treatment and regimen due to unspecified reason: Secondary | ICD-10-CM

## 2021-12-14 DIAGNOSIS — L02512 Cutaneous abscess of left hand: Secondary | ICD-10-CM | POA: Diagnosis present

## 2021-12-14 DIAGNOSIS — H1033 Unspecified acute conjunctivitis, bilateral: Secondary | ICD-10-CM | POA: Diagnosis present

## 2021-12-14 DIAGNOSIS — E11628 Type 2 diabetes mellitus with other skin complications: Secondary | ICD-10-CM | POA: Diagnosis present

## 2021-12-14 DIAGNOSIS — E1165 Type 2 diabetes mellitus with hyperglycemia: Secondary | ICD-10-CM | POA: Diagnosis present

## 2021-12-14 DIAGNOSIS — A411 Sepsis due to other specified staphylococcus: Principal | ICD-10-CM | POA: Diagnosis present

## 2021-12-14 DIAGNOSIS — H109 Unspecified conjunctivitis: Secondary | ICD-10-CM | POA: Diagnosis present

## 2021-12-14 DIAGNOSIS — L03114 Cellulitis of left upper limb: Secondary | ICD-10-CM | POA: Diagnosis present

## 2021-12-14 DIAGNOSIS — R350 Frequency of micturition: Secondary | ICD-10-CM | POA: Diagnosis present

## 2021-12-14 HISTORY — DX: Cellulitis, unspecified: L03.90

## 2021-12-14 HISTORY — DX: Type 2 diabetes mellitus without complications: E11.9

## 2021-12-14 LAB — CBC WITH DIFFERENTIAL/PLATELET
Abs Immature Granulocytes: 0.1 10*3/uL — ABNORMAL HIGH (ref 0.00–0.07)
Basophils Absolute: 0.1 10*3/uL (ref 0.0–0.1)
Basophils Relative: 1 %
Eosinophils Absolute: 0.2 10*3/uL (ref 0.0–0.5)
Eosinophils Relative: 1 %
HCT: 44.2 % (ref 39.0–52.0)
Hemoglobin: 15.7 g/dL (ref 13.0–17.0)
Immature Granulocytes: 1 %
Lymphocytes Relative: 11 %
Lymphs Abs: 1.8 10*3/uL (ref 0.7–4.0)
MCH: 31.3 pg (ref 26.0–34.0)
MCHC: 35.5 g/dL (ref 30.0–36.0)
MCV: 88 fL (ref 80.0–100.0)
Monocytes Absolute: 1.1 10*3/uL — ABNORMAL HIGH (ref 0.1–1.0)
Monocytes Relative: 7 %
Neutro Abs: 12.7 10*3/uL — ABNORMAL HIGH (ref 1.7–7.7)
Neutrophils Relative %: 79 %
Platelets: 221 10*3/uL (ref 150–400)
RBC: 5.02 MIL/uL (ref 4.22–5.81)
RDW: 12.1 % (ref 11.5–15.5)
WBC: 16 10*3/uL — ABNORMAL HIGH (ref 4.0–10.5)
nRBC: 0 % (ref 0.0–0.2)

## 2021-12-14 LAB — COMPREHENSIVE METABOLIC PANEL
ALT: 13 U/L (ref 0–44)
AST: 14 U/L — ABNORMAL LOW (ref 15–41)
Albumin: 3.3 g/dL — ABNORMAL LOW (ref 3.5–5.0)
Alkaline Phosphatase: 159 U/L — ABNORMAL HIGH (ref 38–126)
Anion gap: 8 (ref 5–15)
BUN: 14 mg/dL (ref 6–20)
CO2: 26 mmol/L (ref 22–32)
Calcium: 9.1 mg/dL (ref 8.9–10.3)
Chloride: 98 mmol/L (ref 98–111)
Creatinine, Ser: 0.76 mg/dL (ref 0.61–1.24)
GFR, Estimated: >60 mL/min (ref >60)
Glucose, Bld: 449 mg/dL — ABNORMAL HIGH (ref 70–99)
Potassium: 4.2 mmol/L (ref 3.5–5.1)
Sodium: 132 mmol/L — ABNORMAL LOW (ref 135–145)
Total Bilirubin: 0.4 mg/dL (ref 0.3–1.2)
Total Protein: 7.1 g/dL (ref 6.5–8.1)

## 2021-12-14 NOTE — ED Triage Notes (Signed)
Pt c/o L "hand infection" onset 3 days ago, increase in swelling/pain since. Pt denies SHOB, chills, fever, associated sick symptoms. Denies injury or trauma.

## 2021-12-15 ENCOUNTER — Other Ambulatory Visit: Payer: Self-pay

## 2021-12-15 ENCOUNTER — Encounter (HOSPITAL_COMMUNITY): Payer: Self-pay | Admitting: Internal Medicine

## 2021-12-15 DIAGNOSIS — E1165 Type 2 diabetes mellitus with hyperglycemia: Secondary | ICD-10-CM | POA: Diagnosis present

## 2021-12-15 DIAGNOSIS — D72829 Elevated white blood cell count, unspecified: Secondary | ICD-10-CM | POA: Diagnosis present

## 2021-12-15 DIAGNOSIS — L03114 Cellulitis of left upper limb: Principal | ICD-10-CM | POA: Diagnosis present

## 2021-12-15 DIAGNOSIS — E871 Hypo-osmolality and hyponatremia: Secondary | ICD-10-CM

## 2021-12-15 DIAGNOSIS — H109 Unspecified conjunctivitis: Secondary | ICD-10-CM | POA: Diagnosis present

## 2021-12-15 LAB — GLUCOSE, CAPILLARY
Glucose-Capillary: 196 mg/dL — ABNORMAL HIGH (ref 70–99)
Glucose-Capillary: 180 mg/dL — ABNORMAL HIGH (ref 70–99)

## 2021-12-15 LAB — HEMOGLOBIN A1C
Hgb A1c MFr Bld: 9.9 % — ABNORMAL HIGH (ref 4.8–5.6)
Mean Plasma Glucose: 237.43 mg/dL

## 2021-12-15 LAB — LACTIC ACID, PLASMA: Lactic Acid, Venous: 1.4 mmol/L (ref 0.5–1.9)

## 2021-12-15 LAB — CBG MONITORING, ED: Glucose-Capillary: 258 mg/dL — ABNORMAL HIGH (ref 70–99)

## 2021-12-15 LAB — C-REACTIVE PROTEIN: CRP: 5.7 mg/dL — ABNORMAL HIGH (ref <1.0)

## 2021-12-15 LAB — SEDIMENTATION RATE: Sed Rate: 30 mm/hr — ABNORMAL HIGH (ref 0–16)

## 2021-12-15 LAB — HIV ANTIBODY (ROUTINE TESTING W REFLEX): HIV Screen 4th Generation wRfx: NONREACTIVE

## 2021-12-15 MED ORDER — HYDRALAZINE HCL 20 MG/ML IJ SOLN: 10.0000 mg | INTRAMUSCULAR | Status: DC | PRN

## 2021-12-15 MED ORDER — SODIUM CHLORIDE 0.9 % IV SOLN: INTRAVENOUS | Status: AC

## 2021-12-15 MED ORDER — LACTATED RINGERS IV BOLUS
1000.0000 mL | Freq: Once | INTRAVENOUS | Status: AC
  Administered 2021-12-15: 1000 mL via INTRAVENOUS

## 2021-12-15 MED ORDER — HYDROCODONE-ACETAMINOPHEN 5-325 MG PO TABS: 1.0000 | ORAL_TABLET | Freq: Four times a day (QID) | ORAL | Status: DC | PRN

## 2021-12-15 MED ORDER — ALBUTEROL SULFATE (2.5 MG/3ML) 0.083% IN NEBU: 2.5000 mg | INHALATION_SOLUTION | Freq: Four times a day (QID) | RESPIRATORY_TRACT | Status: DC | PRN

## 2021-12-15 MED ORDER — ONDANSETRON HCL 4 MG PO TABS: 4.0000 mg | ORAL_TABLET | Freq: Four times a day (QID) | ORAL | Status: DC | PRN

## 2021-12-15 MED ORDER — INSULIN ASPART 100 UNIT/ML IJ SOLN
0.0000 [IU] | Freq: Three times a day (TID) | INTRAMUSCULAR | Status: DC
  Administered 2021-12-15 (×2): 3 [IU] via SUBCUTANEOUS
  Administered 2021-12-15: 8 [IU] via SUBCUTANEOUS
  Administered 2021-12-16: 5 [IU] via SUBCUTANEOUS
  Administered 2021-12-16 (×2): 3 [IU] via SUBCUTANEOUS
  Administered 2021-12-17: 5 [IU] via SUBCUTANEOUS
  Administered 2021-12-17: 3 [IU] via SUBCUTANEOUS
  Administered 2021-12-17: 8 [IU] via SUBCUTANEOUS
  Administered 2021-12-18: 5 [IU] via SUBCUTANEOUS

## 2021-12-15 MED ORDER — ACETAMINOPHEN 325 MG PO TABS: 650.0000 mg | ORAL_TABLET | Freq: Four times a day (QID) | ORAL | Status: DC | PRN

## 2021-12-15 MED ORDER — ONDANSETRON HCL 4 MG/2ML IJ SOLN: 4.0000 mg | Freq: Four times a day (QID) | INTRAMUSCULAR | Status: DC | PRN

## 2021-12-15 MED ORDER — SODIUM CHLORIDE 0.9% FLUSH
3.0000 mL | Freq: Two times a day (BID) | INTRAVENOUS | Status: DC
  Administered 2021-12-15 – 2021-12-18 (×4): 3 mL via INTRAVENOUS

## 2021-12-15 MED ORDER — ENOXAPARIN SODIUM 40 MG/0.4ML IJ SOSY
40.0000 mg | PREFILLED_SYRINGE | INTRAMUSCULAR | Status: DC
  Administered 2021-12-15 – 2021-12-17 (×2): 40 mg via SUBCUTANEOUS
  Filled 2021-12-15 (×3): qty 0.4

## 2021-12-15 MED ORDER — ACETAMINOPHEN 650 MG RE SUPP: 650.0000 mg | Freq: Four times a day (QID) | RECTAL | Status: DC | PRN

## 2021-12-15 MED ORDER — CEFAZOLIN SODIUM-DEXTROSE 1-4 GM/50ML-% IV SOLN
1.0000 g | Freq: Once | INTRAVENOUS | Status: AC
  Administered 2021-12-15: 1 g via INTRAVENOUS
  Filled 2021-12-15: qty 50

## 2021-12-15 MED ORDER — FENTANYL CITRATE PF 50 MCG/ML IJ SOSY
50.0000 ug | PREFILLED_SYRINGE | Freq: Once | INTRAMUSCULAR | Status: AC
  Administered 2021-12-15: 50 ug via INTRAVENOUS
  Filled 2021-12-15: qty 1

## 2021-12-15 MED ORDER — INSULIN ASPART 100 UNIT/ML IJ SOLN
0.0000 [IU] | Freq: Every day | INTRAMUSCULAR | Status: DC
  Administered 2021-12-16: 2 [IU] via SUBCUTANEOUS

## 2021-12-15 MED ORDER — CEFAZOLIN SODIUM-DEXTROSE 2-4 GM/100ML-% IV SOLN
2.0000 g | Freq: Three times a day (TID) | INTRAVENOUS | Status: DC
  Administered 2021-12-15 – 2021-12-18 (×9): 2 g via INTRAVENOUS
  Filled 2021-12-15 (×9): qty 100

## 2021-12-15 NOTE — ED Notes (Signed)
Unable to obtain full blood culture sample amount from the right lower forearm.

## 2021-12-15 NOTE — Progress Notes (Signed)
1300- Patient arrived into room 2W28 from ED. A&O x4. Denies pain. VSS. NS infusing at 132mL/hr. Ortho MD, Dr. Leotis Shames at bedside to assess and update patient on plan of care. All questions/concerns addressed. Wife at bedside with the patient as well.

## 2021-12-15 NOTE — Consult Note (Signed)
Reason for Consult:Left hand cellulitis Referring Physician: Madelyn Flavors Time called: 1156 Time at bedside: 1249   Bryan Mueller is an 51 y.o. male.  HPI: Aster comes in with a 3d hx/o left hand redness and pain. 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 He has never had to undergo surgery before. He denies fevers, chills, sweats, N/V. He is RHD. He underwent ED [place] which did not show any fluid collections. Hand surgery was consulted after admission.  Past Medical History:  Diagnosis Date   Cellulitis    Diabetes mellitus, type 2 (HCC)     No past surgical history on file.  No family history on file.  Social History:  reports that he has never smoked. His smokeless tobacco use includes chew. He reports that he does not currently use alcohol. He reports that he does not use drugs.  Allergies: No Known Allergies  Medications: I have reviewed the patient's current medications.  Results for orders placed or performed during the hospital encounter of 12/14/21 (from the past 48 hour(s))  Comprehensive metabolic panel     Status: Abnormal   Collection Time: 12/14/21  4:55 PM  Result Value Ref Range   Sodium 132 (L) 135 - 145 mmol/L   Potassium 4.2 3.5 - 5.1 mmol/L   Chloride 98 98 - 111 mmol/L   CO2 26 22 - 32 mmol/L   Glucose, Bld 449 (H) 70 - 99 mg/dL    Comment: Glucose reference range applies only to samples taken after fasting for at least 8 hours.   BUN 14 6 - 20 mg/dL   Creatinine, Ser 4.09 0.61 - 1.24 mg/dL   Calcium 9.1 8.9 - 81.1 mg/dL   Total Protein 7.1 6.5 - 8.1 g/dL   Albumin 3.3 (L) 3.5 - 5.0 g/dL   AST 14 (L) 15 - 41 U/L   ALT 13 0 - 44 U/L   Alkaline Phosphatase 159 (H) 38 - 126 U/L   Total Bilirubin 0.4 0.3 - 1.2 mg/dL   GFR, Estimated >91 >47 mL/min    Comment: (NOTE) Calculated using the CKD-EPI Creatinine Equation (2021)    Anion gap 8 5 - 15    Comment: Performed at [facility], [address] [place], [place] [address]  CBC with Differential     Status: Abnormal   Collection Time: 12/14/21  4:55 PM  Result Value Ref Range   WBC 16.0 (H) 4.0 - 10.5 K/uL   RBC 5.02 4.22 - 5.81 MIL/uL   Hemoglobin 15.7 13.0 - 17.0 g/dL   HCT 21.3 08.6 - 57.8 %   MCV 88.0 80.0 - 100.0 fL   MCH 31.3 26.0 - 34.0 pg   MCHC 35.5 30.0 - 36.0 g/dL   RDW 46.9 62.9 - 52.8 %   Platelets 221 150 - 400 K/uL   nRBC 0.0 0.0 - 0.2 %   Neutrophils Relative % 79 %   Neutro Abs 12.7 (H) 1.7 - 7.7 K/uL   Lymphocytes Relative 11 %   Lymphs Abs 1.8 0.7 - 4.0 K/uL   Monocytes Relative 7 %   Monocytes Absolute 1.1 (H) 0.1 - 1.0 K/uL   Eosinophils Relative 1 %   Eosinophils Absolute 0.2 0.0 - 0.5 K/uL   Basophils Relative 1 %   Basophils Absolute 0.1 0.0 - 0.1 K/uL   Immature Granulocytes 1 %   Abs Immature Granulocytes 0.10 (H) 0.00 - 0.07 K/uL  Comment: Performed at [facility], [address] [place], [place] [address]  Lactic acid, plasma     Status: None   Collection Time: 12/15/21  6:06 AM  Result Value Ref Range   Lactic Acid, Venous 1.4 0.5 - 1.9 mmol/L    Comment: Performed at [facility], [address], [place], [place] [address]  CBG monitoring, ED     Status: Abnormal   Collection Time: 12/15/21  8:48 AM  Result Value Ref Range   Glucose-Capillary 258 (H) 70 - 99 mg/dL    Comment: Glucose reference range applies only to samples taken after fasting for at least 8 hours.  Hemoglobin A1c     Status: Abnormal   Collection Time: 12/15/21  8:49 AM  Result Value Ref Range   Hgb A1c MFr Bld 9.9 (H) 4.8 - 5.6 %    Comment: (NOTE) Pre diabetes:          5.7%-6.4%  Diabetes:              >6.4%  Glycemic control for   <7.0% adults with diabetes    Mean Plasma Glucose 237.43 mg/dL    Comment: Performed at [facility], [address] [place], [place] [address]  C-reactive protein     Status: Abnormal   Collection Time: 12/15/21  8:49 AM  Result Value Ref Range   CRP  5.7 (H) <1.0 mg/dL    Comment: Performed at [facility], [address], [place], [place] [address]  Glucose, capillary     Status: Abnormal   Collection Time: 12/15/21 12:51 PM  Result Value Ref Range   Glucose-Capillary 196 (H) 70 - 99 mg/dL    Comment: Glucose reference range applies only to samples taken after fasting for at least 8 hours.    DG Hand Complete Left  Result Date: 12/14/2021 CLINICAL DATA:  Left hand infection EXAM: LEFT HAND - COMPLETE 3+ VIEW COMPARISON:  None Available. FINDINGS: No acute fracture or dislocation.The joint spaces are preserved.Alignment is unremarkable.No foreign body. Possible contour abnormality the nailbed of the index finger on the oblique view. IMPRESSION: No acute osseous abnormality. Electronically Signed   By: Wiliam Ke M.D.   On: 12/14/2021 17:43    Review of Systems  Constitutional:  Negative for chills, diaphoresis and fever.  HENT:  Negative for ear discharge, ear pain, hearing loss and tinnitus.   Eyes:  Negative for photophobia and pain.  Respiratory:  Negative for cough and shortness of breath.   Cardiovascular:  Negative for chest pain.  Gastrointestinal:  Negative for abdominal pain, nausea and vomiting.  Genitourinary:  Negative for dysuria, flank pain, frequency and urgency.  Musculoskeletal:  Positive for arthralgias (Left hand). Negative for back pain, myalgias and neck pain.  Neurological:  Negative for dizziness and headaches.  Hematological:  Does not bruise/bleed easily.  Psychiatric/Behavioral:  The patient is not nervous/anxious.    Blood pressure 128/83, pulse 89, temperature 98.5 F (36.9 C), temperature source Oral, resp. rate 16, height 5\' 6"  (1.676 m), weight 71.7 kg, SpO2 99 %. Physical Exam Constitutional:      General: He is not in acute distress.    Appearance: He is well-developed. He is not diaphoretic.  HENT:     Head: Normocephalic and atraumatic.  Eyes:     General: No scleral icterus.        Right eye: No discharge.        Left eye: No discharge.  Conjunctiva/sclera: Conjunctivae normal.  Cardiovascular:     Rate and Rhythm: Normal rate and regular rhythm.  Pulmonary:     Effort: Pulmonary effort is normal. No respiratory distress.  Musculoskeletal:     Cervical back: Normal range of motion.     Comments: Left shoulder, elbow, wrist, digits- no skin wounds, erythema palm prox to 2nd webspace and dorsal same area, mod TTP, no fluctuance, no TTP fingers or prox palm, no instability, no blocks to motion  Sens  Ax/R/M/U intact  Mot   Ax/ R/ PIN/ M/ AIN/ U intact  Rad 2+  Skin:    General: Skin is warm and dry.  Neurological:     Mental Status: He is alert.  Psychiatric:        Mood and Affect: Mood normal.        Behavior: Behavior normal.     Assessment/Plan: Left hand cellulitis -- Given history and lack of fluid collection or e/o tenosynovitis I think it reasonable to see how he responds to abx treatment. Will reevaluate in the AM. Please keep NPO after MN.    Freeman Caldron, PA-C Orthopedic Surgery [phone] 12/15/2021, 1:01 PM

## 2021-12-15 NOTE — H&P (Addendum)
History and Physical    Patient: Bryan Mueller XYO:[id number] DOB: [birth year] DOA: 12/14/2021 DOS: the patient was seen and examined on 12/15/2021 PCP: Patient, No Pcp Per  Patient coming from: Home  Chief Complaint:  Chief Complaint  Patient presents with   Hand Pain   HPI: Bryan Mueller is a 51 y.o. male with medical history significant of diabetes mellitus type 2 not currently on medication and cellulitis who presents with 3-day history of pain and swelling of his left hand.  He makes it seem as though symptoms started between his second and third digit on the palmar aspect as he points to a place where he thinks pulses present.  Over the last couple days he reported redness and increased warmth on the dorsal aspect of his hand going up.  Denies having any fever, chills, nausea, vomiting, diarrhea, dysuria, or abdominal pain.  He does admit to urinary frequency.  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  Patient has had previous infections there before.  He admits that he was previously diagnosed with diabetes, but is not on any medications for treatment as he does not see a primary care provider.  Patient reports that he drinks 1 pack of kava per day on average but is unsure if there is any alcohol content.  He also chews tobacco along with betal nuts.  On admission into the emergency department patient was noted to have temperature up to 99.5 F, blood pressure elevated up to 153/94, and all other vital signs maintained.  Labs significant for WBC 16, sodium 132, glucose 449, and anion gap 8.  X-rays of the left hand showed no acute osseous abnormality.  The ED provider had performed bedside ultrasound which did not show any signs of abscess.  Blood cultures were obtained.  Patient was given 1 L of lactated Ringer's, fentanyl 50 mcg IV, and and started on cefazolin.  Review of Systems: As mentioned in the history of present illness. All other systems reviewed and are  negative. No past medical history on file. No past surgical history on file. Social History:  reports that he has never smoked. His smokeless tobacco use includes chew. He reports that he does not currently use alcohol. He reports that he does not use drugs.  No Known Allergies  No family history on file.  Prior to Admission medications   Medication Sig Start Date End Date Taking? Authorizing Provider  benzonatate (TESSALON) 100 MG capsule Take 1 capsule (100 mg total) by mouth 2 (two) times daily as needed for cough. 09/09/14   Reyne Dumas, MD  cephALEXin (KEFLEX) 500 MG capsule Take 1 capsule (500 mg total) by mouth 4 (four) times daily. 06/03/17   Lacretia Leigh, MD  clindamycin (CLEOCIN) 300 MG capsule Take 1 capsule (300 mg total) by mouth 4 (four) times daily. X 7 days 06/03/18   Domenic Moras, PA-C  doxycycline (VIBRAMYCIN) 100 MG capsule Take 1 capsule (100 mg total) by mouth 2 (two) times daily. 06/03/17   Lacretia Leigh, MD  glipiZIDE (GLUCOTROL) 5 MG tablet Take 0.5 tablets (2.5 mg total) by mouth daily before breakfast. 09/09/14   Reyne Dumas, MD  guaifenesin (ROBITUSSIN) 100 MG/5ML syrup Take 200 mg by mouth 3 (three) times daily as needed for cough.    [provider]  levofloxacin (LEVAQUIN) 500 MG tablet Take 1 tablet (500 mg total) by mouth daily. 09/09/14   Reyne Dumas, MD  metFORMIN (GLUCOPHAGE) 500 MG tablet  Take 1 tablet (500 mg total) by mouth 2 (two) times daily with a meal. 06/03/18   Domenic Moras, PA-C  metroNIDAZOLE (FLAGYL) 500 MG tablet Take 1 tablet (500 mg total) by mouth 3 (three) times daily. 09/09/14   Reyne Dumas, MD  oxyCODONE-acetaminophen (PERCOCET) 5-325 MG tablet Take 2 tablets by mouth every 4 (four) hours as needed. 06/03/18   Domenic Moras, PA-C  pseudoephedrine (SUDAFED) 30 MG tablet Take 30 mg by mouth every 4 (four) hours as needed for congestion.    [provider]    Physical Exam: Vitals:   12/14/21 2038 12/15/21 0038 12/15/21 0504  12/15/21 0619  BP: (!) 144/85 129/86 139/81 137/82  Pulse: 86 87 84 81  Resp: _0 Temp: 98.8 F (37.1 C) 99.4 F (37.4 C) 99.1 F (37.3 C) 99.5 F (37.5 C)  TempSrc: Oral Oral Oral Oral  SpO2: 100% 97% 100% 99%   Exam  Constitutional: Middle-age male who appears to be in no acute distress Eyes: PERRL, conjunctival injection ENMT: Mucous membranes are moist. Posterior pharynx clear of any exudate or lesions.  Neck: normal, supple Respiratory: clear to auscultation bilaterally, no wheezing, no crackles. Normal respiratory effort. No accessory muscle use.  Cardiovascular: Regular rate and rhythm, no murmurs / rubs / gallops. No extremity edema. 2+ pedal pulses. No carotid bruits.  Abdomen: no tenderness, no masses palpated. . Bowel sounds positive.  Musculoskeletal: no clubbing / cyanosis.  Inability to make a fist or fully extend fingers and left hand.   Skin: Redness erythema noted on the dorsal aspect of the left hand with area of discoloration in between the second and third digit. Neurologic: CN 2-12 grossly intact. Sensation intact, DTR normal. Strength 5/5 in all 4.  Psychiatric: Normal judgment and insight. Alert and oriented x 3. Normal mood.   Data Reviewed:  Reviewed labs and imaging as noted above in HPI Assessment and Plan:  Cellulitis of the hand Acute.  Patient presents with acute swelling and redness of the left hand over the last 3 days.  X-rays noted no acute osseous abnormality.  ED provider performed bedside ultrasound that did not reveal clear signs of abscess.  He has a prior history of prior history of cellulitis with abscesses in the past likely related to uncontrolled diabetes.  -Admit to MedSurg bed -Elevate affected extremity -Follow-up blood cultures -Check ESR and CRP -Continue IV antibiotics of cefazolin -Hydrocodone p.o. as needed for pain -And surgery consulted to eval.  Leukocytosis Acute.  WBC elevated at 16 with lactic acid 1.4  patient did not meet any other criteria for sepsis.  Leukocytosis thought secondary to cellulitis of the hand. -Repeat CBC tomorrow morning  Uncontrolled diabetes mellitus type 2 without with hyperglycemia Patient presents with initial glucose of 449 with anion gap within normal limit.  Last hemoglobin A1c 8.1 back in 2016.  He is not taking any medications currently. -Hypoglycemic protocol -Check hemoglobin A1c -CBGs before every meal with moderate SSI -Diabetic education consulted.  Conjunctivitis Acute.  On physical exam patient bilateral conjunctival injection.  Suspect viral etiology as it is bilateral.  Patient makes it seem like this is not new. -Continue to monitor as he should have empiric coverage with antibiotics above  Hyponatremia Acute.  Sodium 132 on admission, but within normal limits when adjusted for patient's hyperglycemia. -Continue to monitor  Lacks primary care provider Patient noted not to have any insurance or primary care provider. -Transitions of care consulted  DVT prophylaxis: Lovenox  Advance Care Planning:   Code Status: Full Code    Consults: None  Family Communication: Significant other updated at bedside  Severity of Illness: The appropriate patient status for this patient is INPATIENT. Inpatient status is judged to be reasonable and necessary in order to provide the required intensity of service to ensure the patient's safety. The patient's presenting symptoms, physical exam findings, and initial radiographic and laboratory data in the context of their chronic comorbidities is felt to place them at high risk for further clinical deterioration. Furthermore, it is not anticipated that the patient will be medically stable for discharge from the hospital within 2 midnights of admission.   * I certify that at the point of admission it is my clinical judgment that the patient will require inpatient hospital care spanning beyond 2 midnights from the point of  admission due to high intensity of service, high risk for further deterioration and high frequency of surveillance required.*  Author: Norval Morton, MD 12/15/2021 7:47 AM  For on call review [email or web address].

## 2021-12-15 NOTE — ED Notes (Signed)
ED TO INPATIENT HANDOFF REPORT  ED Nurse Name and Phone #: Duwayne Heck [phone]  S Name/Age/Gender Bryan Mueller 51 y.o. male Room/Bed: H021C/H021C  Code Status   Code Status: Full Code  Home/SNF/Other Home Patient oriented to: self, place, time, and situation Is this baseline? Yes   Triage Complete: Triage complete  Chief Complaint Cellulitis of left hand [L03.114]  Triage Note Pt c/o L "hand infection" onset 3 days ago, increase in swelling/pain since. Pt denies SHOB, chills, fever, associated sick symptoms. Denies injury or trauma.    Allergies No Known Allergies  Level of Care/Admitting Diagnosis ED Disposition     ED Disposition  Admit   Condition  --   Comment  Hospital Area: [facility] [[id number]]  Level of Care: Med-Surg [16]  May admit patient to [facility] or [facility] if equivalent level of care is available:: No  Covid Evaluation: Asymptomatic - no recent exposure (last 10 days) testing not required  Diagnosis: Cellulitis of left hand [010272]  Admitting Physician: Clydie Braun [[id number]]  Attending Physician: Clydie Braun [[id number]]  Certification:: I certify this patient will need inpatient services for at least 2 midnights  Estimated Length of Stay: 3          B Medical/Surgery History Past Medical History:  Diagnosis Date   Cellulitis    Diabetes mellitus, type 2 (HCC)    No past surgical history on file.   A IV Location/Drains/Wounds Patient Lines/Drains/Airways Status     Active Line/Drains/Airways     Name Placement date Placement time Site Days   Peripheral IV 12/15/21 20 G Anterior;Left Forearm 12/15/21  0601  Forearm  less than 1            Intake/Output Last 24 hours  Intake/Output Summary (Last 24 hours) at 12/15/2021 1117 Last data filed at 12/15/2021 0749 Gross per 24 hour  Intake 999 ml  Output --  Net 999 ml    Labs/Imaging Results for orders placed or performed during the hospital  encounter of 12/14/21 (from the past 48 hour(s))  Comprehensive metabolic panel     Status: Abnormal   Collection Time: 12/14/21  4:55 PM  Result Value Ref Range   Sodium 132 (L) 135 - 145 mmol/L   Potassium 4.2 3.5 - 5.1 mmol/L   Chloride 98 98 - 111 mmol/L   CO2 26 22 - 32 mmol/L   Glucose, Bld 449 (H) 70 - 99 mg/dL    Comment: Glucose reference range applies only to samples taken after fasting for at least 8 hours.   BUN 14 6 - 20 mg/dL   Creatinine, Ser 5.63 0.61 - 1.24 mg/dL   Calcium 9.1 8.9 - 87.5 mg/dL   Total Protein 7.1 6.5 - 8.1 g/dL   Albumin 3.3 (L) 3.5 - 5.0 g/dL   AST 14 (L) 15 - 41 U/L   ALT 13 0 - 44 U/L   Alkaline Phosphatase 159 (H) 38 - 126 U/L   Total Bilirubin 0.4 0.3 - 1.2 mg/dL   GFR, Estimated >64 >33 mL/min    Comment: (NOTE) Calculated using the CKD-EPI Creatinine Equation (2021)    Anion gap 8 5 - 15    Comment: Performed at [facility], [address] [place], [place] [address]  CBC with Differential     Status: Abnormal   Collection Time: 12/14/21  4:55 PM  Result Value Ref Range   WBC 16.0 (H) 4.0 - 10.5 K/uL   RBC  5.02 4.22 - 5.81 MIL/uL   Hemoglobin 15.7 13.0 - 17.0 g/dL   HCT 40.9 81.1 - 91.4 %   MCV 88.0 80.0 - 100.0 fL   MCH 31.3 26.0 - 34.0 pg   MCHC 35.5 30.0 - 36.0 g/dL   RDW 78.2 95.6 - 21.3 %   Platelets 221 150 - 400 K/uL   nRBC 0.0 0.0 - 0.2 %   Neutrophils Relative % 79 %   Neutro Abs 12.7 (H) 1.7 - 7.7 K/uL   Lymphocytes Relative 11 %   Lymphs Abs 1.8 0.7 - 4.0 K/uL   Monocytes Relative 7 %   Monocytes Absolute 1.1 (H) 0.1 - 1.0 K/uL   Eosinophils Relative 1 %   Eosinophils Absolute 0.2 0.0 - 0.5 K/uL   Basophils Relative 1 %   Basophils Absolute 0.1 0.0 - 0.1 K/uL   Immature Granulocytes 1 %   Abs Immature Granulocytes 0.10 (H) 0.00 - 0.07 K/uL    Comment: Performed at [facility], [address] [place], [place] [address]  Lactic acid, plasma     Status: None   Collection Time: 12/15/21  6:06 AM   Result Value Ref Range   Lactic Acid, Venous 1.4 0.5 - 1.9 mmol/L    Comment: Performed at [facility], [address] [place], [place] [address]  CBG monitoring, ED     Status: Abnormal   Collection Time: 12/15/21  8:48 AM  Result Value Ref Range   Glucose-Capillary 258 (H) 70 - 99 mg/dL    Comment: Glucose reference range applies only to samples taken after fasting for at least 8 hours.   DG Hand Complete Left  Result Date: 12/14/2021 CLINICAL DATA:  Left hand infection EXAM: LEFT HAND - COMPLETE 3+ VIEW COMPARISON:  None Available. FINDINGS: No acute fracture or dislocation.The joint spaces are preserved.Alignment is unremarkable.No foreign body. Possible contour abnormality the nailbed of the index finger on the oblique view. IMPRESSION: No acute osseous abnormality. Electronically Signed   By: Wiliam Ke M.D.   On: 12/14/2021 17:43    Pending Labs Unresulted Labs (From admission, onward)     Start     Ordered   12/16/21 0500  CBC  Tomorrow morning,   R        12/15/21 0757   12/16/21 0500  Basic metabolic panel  Tomorrow morning,   R        12/15/21 0757   12/15/21 0758  Sedimentation rate  Once,   R        12/15/21 0758   12/15/21 0758  C-reactive protein  Once,   R        12/15/21 0758   12/15/21 0754  Hemoglobin A1c  Once,   R        12/15/21 0757   12/15/21 0753  HIV Antibody (routine testing w rflx)  (HIV Antibody (Routine testing w reflex) panel)  Once,   R        12/15/21 0757   12/15/21 0515  Blood culture (routine x 2)  BLOOD CULTURE X 2,   R      12/15/21 0514            Vitals/Pain Today's Vitals   12/15/21 0750 12/15/21 0752 12/15/21 0819 12/15/21 1018  BP:      Pulse:      Resp:      Temp:      TempSrc:      SpO2: 99%  PainSc:  6  Asleep Asleep    Isolation Precautions No active isolations  Medications Medications  enoxaparin (LOVENOX) injection 40 mg (40 mg Subcutaneous Given 12/15/21 1009)  sodium chloride flush (NS) 0.9 %  injection 3 mL (3 mLs Intravenous Not Given 12/15/21 1010)  0.9 %  sodium chloride infusion ( Intravenous New Bag/Given 12/15/21 0848)  acetaminophen (TYLENOL) tablet 650 mg (has no administration in time range)    Or  acetaminophen (TYLENOL) suppository 650 mg (has no administration in time range)  HYDROcodone-acetaminophen (NORCO/VICODIN) 5-325 MG per tablet 1 tablet (has no administration in time range)  ondansetron (ZOFRAN) tablet 4 mg (has no administration in time range)    Or  ondansetron (ZOFRAN) injection 4 mg (has no administration in time range)  albuterol (PROVENTIL) (2.5 MG/3ML) 0.083% nebulizer solution 2.5 mg (has no administration in time range)  hydrALAZINE (APRESOLINE) injection 10 mg (has no administration in time range)  ceFAZolin (ANCEF) IVPB 2g/100 mL premix (2 g Intravenous New Bag/Given 12/15/21 1112)  insulin aspart (novoLOG) injection 0-15 Units (8 Units Subcutaneous Given 12/15/21 0851)  insulin aspart (novoLOG) injection 0-5 Units (has no administration in time range)  ceFAZolin (ANCEF) IVPB 1 g/50 mL premix (0 g Intravenous Stopped 12/15/21 0635)  lactated ringers bolus 1,000 mL (0 mLs Intravenous Stopped 12/15/21 0749)  fentaNYL (SUBLIMAZE) injection 50 mcg (50 mcg Intravenous Given 12/15/21 0749)    Mobility walks Low fall risk   Focused Assessments    R Recommendations: See Admitting Provider Note  Report given to:   Additional Notes: Pt is ambulatory, with a 20 gauge in the left forearm.

## 2021-12-15 NOTE — ED Provider Notes (Signed)
[place] EMERGENCY DEPARTMENT Provider Note   CSN: [id number] Arrival date & time: 12/14/21  1538     History  Chief Complaint  Patient presents with   Hand Pain    Lenard Kampf is a 51 y.o. male with unmanaged diabetes who presents with concern for 3 days of progressively worsening left-sided hand swelling pain and redness.  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  States that he regularly has breakdown on the palm of his hand at the base of his fingers and has had infection there before.  States that it began to be painful and swollen at the base of his left middle finger on the palmar aspect 3 days ago and progressively his hand has gotten more painful, more swollen, and red and hot to the touch.  No fevers or chills at home, no nausea or vomiting.  No numbness, tingling or weakness in his hands.  Patient was diagnosed with diabetes in 2016 while admitted to the hospital, however states that he has not seen any physician in the outpatient setting since that hospitalization and is not treated for his diabetes.  He denies any other medical problems at this time.  No recent steroids or antibiotics.  HPI     Home Medications Prior to Admission medications   Medication Sig Start Date End Date Taking? Authorizing Provider  benzonatate (TESSALON) 100 MG capsule Take 1 capsule (100 mg total) by mouth 2 (two) times daily as needed for cough. 09/09/14   Reyne Dumas, MD  cephALEXin (KEFLEX) 500 MG capsule Take 1 capsule (500 mg total) by mouth 4 (four) times daily. 06/03/17   Lacretia Leigh, MD  clindamycin (CLEOCIN) 300 MG capsule Take 1 capsule (300 mg total) by mouth 4 (four) times daily. X 7 days 06/03/18   Domenic Moras, PA-C  doxycycline (VIBRAMYCIN) 100 MG capsule Take 1 capsule (100 mg total) by mouth 2 (two) times daily. 06/03/17   Lacretia Leigh, MD  glipiZIDE (GLUCOTROL) 5 MG tablet Take 0.5 tablets (2.5 mg total) by  mouth daily before breakfast. 09/09/14   Reyne Dumas, MD  guaifenesin (ROBITUSSIN) 100 MG/5ML syrup Take 200 mg by mouth 3 (three) times daily as needed for cough.    [provider]  levofloxacin (LEVAQUIN) 500 MG tablet Take 1 tablet (500 mg total) by mouth daily. 09/09/14   Reyne Dumas, MD  metFORMIN (GLUCOPHAGE) 500 MG tablet Take 1 tablet (500 mg total) by mouth 2 (two) times daily with a meal. 06/03/18   Domenic Moras, PA-C  metroNIDAZOLE (FLAGYL) 500 MG tablet Take 1 tablet (500 mg total) by mouth 3 (three) times daily. 09/09/14   Reyne Dumas, MD  oxyCODONE-acetaminophen (PERCOCET) 5-325 MG tablet Take 2 tablets by mouth every 4 (four) hours as needed. 06/03/18   Domenic Moras, PA-C  pseudoephedrine (SUDAFED) 30 MG tablet Take 30 mg by mouth every 4 (four) hours as needed for congestion.    [provider]      Allergies    Patient has no known allergies.    Review of Systems   Review of Systems  Musculoskeletal:        Hand pain and swelling, redness.     Physical Exam Updated Vital Signs BP 137/82 (BP Location: Right Arm)   Pulse 81   Temp 99.5 F (37.5 C) (Oral)   Resp 15   SpO2 99%  Physical Exam Vitals and nursing note reviewed.  Constitutional:      Appearance: He is not ill-appearing or toxic-appearing.  HENT:     Head: Normocephalic and atraumatic.     Mouth/Throat:     Mouth: Mucous membranes are moist.     Pharynx: No oropharyngeal exudate or posterior oropharyngeal erythema.  Eyes:     General:        Right eye: No discharge.        Left eye: No discharge.     Conjunctiva/sclera: Conjunctivae normal.  Cardiovascular:     Rate and Rhythm: Normal rate and regular rhythm.     Pulses: Normal pulses.     Heart sounds: Normal heart sounds. No murmur heard. Pulmonary:     Effort: Pulmonary effort is normal. No respiratory distress.     Breath sounds: Normal breath sounds. No wheezing or rales.  Abdominal:     General: Bowel sounds are normal.  There is no distension.     Palpations: Abdomen is soft.     Tenderness: There is no abdominal tenderness.  Musculoskeletal:        General: No deformity.       Hands:     Cervical back: Neck supple.     Right lower leg: No edema.     Left lower leg: No edema.     Comments: capillary refill and sensation in all digits, normal radial pulses bilaterally  Skin:    General: Skin is warm and dry.     Capillary Refill: Capillary refill takes less than 2 seconds.     Findings: Erythema present.  Neurological:     General: No focal deficit present.     Mental Status: He is alert and oriented to person, place, and time. Mental status is at baseline.  Psychiatric:        Mood and Affect: Mood normal.          ED Results / Procedures / Treatments   Labs (all labs ordered are listed, but only abnormal results are displayed) Labs Reviewed  COMPREHENSIVE METABOLIC PANEL - Abnormal; Notable for the following components:      Result Value   Sodium 132 (*)    Glucose, Bld 449 (*)    Albumin 3.3 (*)    AST 14 (*)    Alkaline Phosphatase 159 (*)    All other components within normal limits  CBC WITH DIFFERENTIAL/PLATELET - Abnormal; Notable for the following components:   WBC 16.0 (*)    Neutro Abs 12.7 (*)    Monocytes Absolute 1.1 (*)    Abs Immature Granulocytes 0.10 (*)    All other components within normal limits  CULTURE, BLOOD (ROUTINE X 2)  CULTURE, BLOOD (ROUTINE X 2)  LACTIC ACID, PLASMA  LACTIC ACID, PLASMA    EKG None  Radiology DG Hand Complete Left  Result Date: 12/14/2021 CLINICAL DATA:  Left hand infection EXAM: LEFT HAND - COMPLETE 3+ VIEW COMPARISON:  None Available. FINDINGS: No acute fracture or dislocation.The joint spaces are preserved.Alignment is unremarkable.No foreign body. Possible contour abnormality the nailbed of the index finger on the oblique view. IMPRESSION: No acute osseous abnormality. Electronically Signed   By: Merilyn Baba M.D.   On:  12/14/2021 17:43    Procedures Ultrasound ED Soft Tissue  Date/Time: 12/15/2021 7:01 AM  Performed by: Emeline Darling, PA-C Authorized by: Emeline Darling, PA-C   Procedure details:    Indications: evaluate for cellulitis     Transverse view:  Visualized   Longitudinal view:  Visualized   Images: not archived   Location:    Location: upper extremity     Location comment:  Left hand   Side:  Left Findings:     no abscess present    cellulitis present Comments:     Cobblestoning, no fluid collection     Medications Ordered in ED Medications  ceFAZolin (ANCEF) IVPB 1 g/50 mL premix (0 g Intravenous Stopped 12/15/21 0635)  lactated ringers bolus 1,000 mL (1,000 mLs Intravenous New Bag/Given 12/15/21 0605)    ED Course/ Medical Decision Making/ A&P                           Medical Decision Making 51 year old diabetic male who presents with concern for infection of the left hand.  Vital signs are normal on intake, vitals otherwise normal.  Cardiopulmonary exam is normal, abdominal exam is benign.  Patient with skin changes on exam of the left hand as above, see photos.  Findings concerning for cellulitis of the left hand.  Bedside ultrasound used to evaluate for abscess, without identification of fluid collection.  Cellulitic changes.  Amount and/or Complexity of Data Reviewed Labs: ordered.    Details: CBC with leukocytosis to 16,000, no anemia.  CMP with hyponatremia 132, hyperglycemia to 449, elevated alk phos to 159. Radiology: ordered.    Details: Hand x-ray negative for acute osseous abnormality or soft tissue gas.  Risk Prescription drug management. Decision regarding hospitalization.   Given rapid progression of infection in the last 72 hours with streaking proximally towards the forearm do feel patient would benefit from mission the hospital for IV antibiotics.  At time of shift change care of patient signed out to oncoming ED provider A. Deatra Canter, PA-C.   All pertinent HPI and physical exam, laboratory findings were discussed with him prior to my departure.  At At time of shift change patient only awaiting consult call for admission.  Antibiotics have been started, analgesia ordered.  Patient is hemodynamically stable.  [place] and his wife  voiced understanding of his medical evaluation and treatment plan. Each of their questions answered to their expressed satisfaction.    This chart was dictated using voice recognition software, Dragon. Despite the best efforts of this provider to proofread and correct errors, errors may still occur which can change documentation meaning.  Final Clinical Impression(s) / ED Diagnoses Final diagnoses:  Cellulitis of left hand    Rx / DC Orders ED Discharge Orders     None         Aura Dials 12/15/21 0708    Fatima Blank, MD 12/16/21 [phone]

## 2021-12-15 NOTE — Inpatient Diabetes Management (Signed)
Inpatient Diabetes Program Recommendations  AACE/ADA: New Consensus Statement on Inpatient Glycemic Control   Target Ranges:  Prepandial:   less than 140 mg/dL      Peak postprandial:   less than 180 mg/dL (1-2 hours)      Critically ill patients:  140 - 180 mg/dL    Latest Reference Range & Units 12/15/21 08:48  Glucose-Capillary 70 - 99 mg/dL 449 (H)    Latest Reference Range & Units 12/14/21 16:55  Glucose 70 - 99 mg/dL 201 (H)   Review of Glycemic Control  Diabetes history: DM2 Outpatient Diabetes medications: Glipizide 2.5 mg daily, Metformin 500 mg BID Current orders for Inpatient glycemic control: Novolog 0-15 units TID with meals, Novolog 0-5 units QHS  Inpatient Diabetes Program Recommendations:    Insulin: If glucose is consistently over 180 mg/dl with Novolog correction, please consider ordering Semglee 10 units Q24H.  HbgA1C: A1C in process.   Thanks, Orlando Penner, RN, MSN, CDCES Diabetes Coordinator Inpatient Diabetes Program [phone] (Team Pager from 8am to 5pm)

## 2021-12-15 NOTE — ED Notes (Signed)
Patient ambulated to the bathroom with wife's assistance

## 2021-12-15 NOTE — ED Provider Notes (Signed)
Signout received on this 51 year old male who presents with left hand pain and swelling for 3 days duration.  Exam and workup consistent with cellulitis.  Antibiotics initiated.  At the time of signout patient is awaiting call to hospitalist for admission.  Hand consulted given no evidence of abscess, gas, or osteo on imaging. Physical Exam  BP 137/82 (BP Location: Right Arm)   Pulse 81   Temp 99.5 F (37.5 C) (Oral)   Resp 15   SpO2 99%     Procedures  Procedures  ED Course / MDM    Medical Decision Making Amount and/or Complexity of Data Reviewed Labs: ordered. Radiology: ordered.  Risk Prescription drug management. Decision regarding hospitalization.   Discussed with hospitalist will evaluate patient for admission.       Marita Kansas, PA-C 12/15/21 Darliss Ridgel    Alvira Monday, MD 12/15/21 [phone]

## 2021-12-16 DIAGNOSIS — E119 Type 2 diabetes mellitus without complications: Secondary | ICD-10-CM

## 2021-12-16 DIAGNOSIS — R7881 Bacteremia: Secondary | ICD-10-CM

## 2021-12-16 DIAGNOSIS — B957 Other staphylococcus as the cause of diseases classified elsewhere: Secondary | ICD-10-CM

## 2021-12-16 LAB — BLOOD CULTURE ID PANEL (REFLEXED) - BCID2

## 2021-12-16 LAB — BASIC METABOLIC PANEL
Anion gap: 9 (ref 5–15)
BUN: 11 mg/dL (ref 6–20)
CO2: 25 mmol/L (ref 22–32)
Calcium: 8.5 mg/dL — ABNORMAL LOW (ref 8.9–10.3)
Chloride: 100 mmol/L (ref 98–111)
Creatinine, Ser: 0.64 mg/dL (ref 0.61–1.24)
GFR, Estimated: >60 mL/min (ref >60)
Glucose, Bld: 228 mg/dL — ABNORMAL HIGH (ref 70–99)
Potassium: 4 mmol/L (ref 3.5–5.1)
Sodium: 134 mmol/L — ABNORMAL LOW (ref 135–145)

## 2021-12-16 LAB — CBC
HCT: 42.2 % (ref 39.0–52.0)
Hemoglobin: 14.7 g/dL (ref 13.0–17.0)
MCH: 30.7 pg (ref 26.0–34.0)
MCHC: 34.8 g/dL (ref 30.0–36.0)
MCV: 88.1 fL (ref 80.0–100.0)
Platelets: 211 10*3/uL (ref 150–400)
RBC: 4.79 MIL/uL (ref 4.22–5.81)
RDW: 11.9 % (ref 11.5–15.5)
WBC: 13.5 10*3/uL — ABNORMAL HIGH (ref 4.0–10.5)
nRBC: 0 % (ref 0.0–0.2)

## 2021-12-16 LAB — GLUCOSE, CAPILLARY
Glucose-Capillary: 163 mg/dL — ABNORMAL HIGH (ref 70–99)
Glucose-Capillary: 246 mg/dL — ABNORMAL HIGH (ref 70–99)
Glucose-Capillary: 168 mg/dL — ABNORMAL HIGH (ref 70–99)
Glucose-Capillary: 177 mg/dL — ABNORMAL HIGH (ref 70–99)
Glucose-Capillary: 228 mg/dL — ABNORMAL HIGH (ref 70–99)

## 2021-12-16 NOTE — Progress Notes (Signed)
PROGRESS NOTE    Leman Martinek  CZY:[id number] DOB: 15-Nov-1970 DOA: 12/14/2021 PCP: Patient, No Pcp Per    Brief Narrative:  51 year old untreated type 2 diabetes presented with 3 days of left hand pain and swelling found to have spreading cellulitis and admitted to the hospital.  Blood sugars 449 on arrival.  X-ray without any abnormalities.  Bedside ultrasound without collection.  Hand surgery consulted.   Assessment & Plan:   Left hand cellulitis and abscess in a patient with uncontrolled type 2 diabetes: Sepsis present on admission secondary to staphylococcal bacteremia: -Admitted to the hospital for IV antibiotics.  Continue Ancef today. -Blood cultures with Staphylococcus lugdunensis, no MRSA identified so we will continue Ancef today.  ID following.  Recheck blood cultures to ensure clearance of bacteremia. -Significant swelling of the hand, hand surgery following and at this time recommending conservative management with close follow-up.  Elevate affected hand.  Hot compress.  Type 2 diabetes, poorly controlled with hyperglycemia: Long time diagnosed.  Untreated.  Noncompliant. Will need PCP on discharge. Given insulin in the hospital perioperative. Consult dietitian, diet and lifestyle modification discussed.  Agreeable. Wants to go home on oral diabetic agents, will prescribe multiple diabetic agents on discharge with outpatient follow-up. Hemoglobin A1c 9.9.   DVT prophylaxis: enoxaparin (LOVENOX) injection 40 mg Start: 12/15/21 1000   Code Status: Full code Family Communication: Wife at the bedside Disposition Plan: Status is: Inpatient Remains inpatient appropriate because: IV antibiotics     Consultants:  Hand surgery ID  Procedures:  None  Antimicrobials:  Ancef 8/9---   Subjective: Patient seen and examined.  Denies overnight events.  Mild pain and swelling persist on the left hand.  Eating regular food from outside. Agreeable to go on treatment  plan.  Objective: Vitals:   12/15/21 1256 12/15/21 1604 12/15/21 1917 12/16/21 0815  BP: 128/83 126/80 133/80 116/81  Pulse: 89 89 86 93  Resp:  15 15 12   Temp: 98.5 F (36.9 C) 98.5 F (36.9 C) 98.6 F (37 C) 98.5 F (36.9 C)  TempSrc: Oral Oral Oral   SpO2: 99% 100% 99% 98%  Weight: 71.7 kg     Height: 5\' 6"  (1.676 m)       Intake/Output Summary (Last 24 hours) at 12/16/2021 1419 Last data filed at 12/16/2021 0900 Gross per 24 hour  Intake 704.73 ml  Output --  Net 704.73 ml   Filed Weights   12/15/21 1256  Weight: 71.7 kg    Examination:  General exam: Appears calm and comfortable  Respiratory system: Clear to auscultation. Respiratory effort normal. Cardiovascular system: S1 & S2 heard, RRR. No JVD, murmurs, rubs, gallops or clicks. No pedal edema. Gastrointestinal system: Abdomen is nondistended, soft and nontender. No organomegaly or masses felt. Normal bowel sounds heard. Central nervous system: Alert and oriented. No focal neurological deficits. Extremities: Symmetric 5 x 5 power. Skin:  Swelling and edema mostly on the dorsum of the left hand, shiny skin, pitting but no obvious fluctuation.  Distal neurovascular status intact.  Some swelling and fullness of the plantar aspect of the hand.    Data Reviewed: I have personally reviewed following labs and imaging studies  CBC: Recent Labs  Lab 12/14/21 1655 12/16/21 0545  WBC 16.0* 13.5*  NEUTROABS 12.7*  --   HGB 15.7 14.7  HCT 44.2 42.2  MCV 88.0 88.1  PLT 221 211   Basic Metabolic Panel: Recent Labs  Lab 12/14/21 1655 12/16/21 0545  NA 132* 134*  K 4.2  4.0  CL 98 100  CO2 26 25  GLUCOSE 449* 228*  BUN 14 11  CREATININE 0.76 0.64  CALCIUM 9.1 8.5*   GFR: Estimated Creatinine Clearance: 100.8 mL/min (by C-G formula based on SCr of 0.64 mg/dL). Liver Function Tests: Recent Labs  Lab 12/14/21 1655  AST 14*  ALT 13  ALKPHOS 159*  BILITOT 0.4  PROT 7.1  ALBUMIN 3.3*   No results for  input(s): "LIPASE", "AMYLASE" in the last 168 hours. No results for input(s): "AMMONIA" in the last 168 hours. Coagulation Profile: No results for input(s): "INR", "PROTIME" in the last 168 hours. Cardiac Enzymes: No results for input(s): "CKTOTAL", "CKMB", "CKMBINDEX", "TROPONINI" in the last 168 hours. BNP (last 3 results) No results for input(s): "PROBNP" in the last 8760 hours. HbA1C: Recent Labs    12/15/21 0849  HGBA1C 9.9*   CBG: Recent Labs  Lab 12/15/21 1251 12/15/21 1607 12/15/21 2107 12/16/21 0817 12/16/21 1210  GLUCAP 196* 180* 163* 246* 168*   Lipid Profile: No results for input(s): "CHOL", "HDL", "LDLCALC", "TRIG", "CHOLHDL", "LDLDIRECT" in the last 72 hours. Thyroid Function Tests: No results for input(s): "TSH", "T4TOTAL", "FREET4", "T3FREE", "THYROIDAB" in the last 72 hours. Anemia Panel: No results for input(s): "VITAMINB12", "FOLATE", "FERRITIN", "TIBC", "IRON", "RETICCTPCT" in the last 72 hours. Sepsis Labs: Recent Labs  Lab 12/15/21 0606  LATICACIDVEN 1.4    Recent Results (from the past 240 hour(s))  Blood culture (routine x 2)     Status: None (Preliminary result)   Collection Time: 12/15/21  6:06 AM   Specimen: BLOOD LEFT FOREARM  Result Value Ref Range Status   Specimen Description BLOOD LEFT FOREARM  Final   Special Requests   Final    BOTTLES DRAWN AEROBIC AND ANAEROBIC Blood Culture adequate volume   Culture  Setup Time   Final    GRAM POSITIVE COCCI IN CLUSTERS IN BOTH AEROBIC AND ANAEROBIC BOTTLES CRITICAL RESULT CALLED TO, READ BACK BY AND VERIFIED WITH: PHARMD A. PAYTES [id number] @0806  FH Performed at [facility], [address] [place], [place]    Culture GRAM POSITIVE COCCI  Final   Report Status PENDING  Incomplete  Blood Culture ID Panel (Reflexed)     Status: Abnormal   Collection Time: 12/15/21  6:06 AM  Result Value Ref Range Status   Enterococcus faecalis NOT DETECTED NOT DETECTED Final   Enterococcus  Faecium NOT DETECTED NOT DETECTED Final   Listeria monocytogenes NOT DETECTED NOT DETECTED Final   Staphylococcus species DETECTED (A) NOT DETECTED Final    Comment: CRITICAL RESULT CALLED TO, READ BACK BY AND VERIFIED WITH: PHARMD A. PAYTES 02/14/22 @0807  FH    Staphylococcus aureus (BCID) NOT DETECTED NOT DETECTED Final   Staphylococcus epidermidis NOT DETECTED NOT DETECTED Final   Staphylococcus lugdunensis DETECTED (A) NOT DETECTED Final    Comment: CRITICAL RESULT CALLED TO, READ BACK BY AND VERIFIED WITH: PHARMD A. PAYTES [id number] @0807  FH    Streptococcus species NOT DETECTED NOT DETECTED Final   Streptococcus agalactiae NOT DETECTED NOT DETECTED Final   Streptococcus pneumoniae NOT DETECTED NOT DETECTED Final   Streptococcus pyogenes NOT DETECTED NOT DETECTED Final   A.calcoaceticus-baumannii NOT DETECTED NOT DETECTED Final   Bacteroides fragilis NOT DETECTED NOT DETECTED Final   Enterobacterales NOT DETECTED NOT DETECTED Final   Enterobacter cloacae complex NOT DETECTED NOT DETECTED Final   Escherichia coli NOT DETECTED NOT DETECTED Final   Klebsiella aerogenes NOT DETECTED NOT DETECTED Final   Klebsiella oxytoca  NOT DETECTED NOT DETECTED Final   Klebsiella pneumoniae NOT DETECTED NOT DETECTED Final   Proteus species NOT DETECTED NOT DETECTED Final   Salmonella species NOT DETECTED NOT DETECTED Final   Serratia marcescens NOT DETECTED NOT DETECTED Final   Haemophilus influenzae NOT DETECTED NOT DETECTED Final   Neisseria meningitidis NOT DETECTED NOT DETECTED Final   Pseudomonas aeruginosa NOT DETECTED NOT DETECTED Final   Stenotrophomonas maltophilia NOT DETECTED NOT DETECTED Final   Candida albicans NOT DETECTED NOT DETECTED Final   Candida auris NOT DETECTED NOT DETECTED Final   Candida glabrata NOT DETECTED NOT DETECTED Final   Candida krusei NOT DETECTED NOT DETECTED Final   Candida parapsilosis NOT DETECTED NOT DETECTED Final   Candida tropicalis NOT DETECTED NOT  DETECTED Final   Cryptococcus neoformans/gattii NOT DETECTED NOT DETECTED Final   Methicillin resistance mecA/C NOT DETECTED NOT DETECTED Final    Comment: Performed at [facility], [address] [place], [place] [address]  Blood culture (routine x 2)     Status: None (Preliminary result)   Collection Time: 12/15/21  6:07 AM   Specimen: BLOOD RIGHT FOREARM  Result Value Ref Range Status   Specimen Description BLOOD RIGHT FOREARM  Final   Special Requests   Final    BOTTLES DRAWN AEROBIC AND ANAEROBIC Blood Culture results may not be optimal due to an inadequate volume of blood received in culture bottles   Culture   Final    NO GROWTH < 24 HOURS Performed at [facility], [address] [place], [place] [address]    Report Status PENDING  Incomplete         Radiology Studies: DG Hand Complete Left  Result Date: 12/14/2021 CLINICAL DATA:  Left hand infection EXAM: LEFT HAND - COMPLETE 3+ VIEW COMPARISON:  None Available. FINDINGS: No acute fracture or dislocation.The joint spaces are preserved.Alignment is unremarkable.No foreign body. Possible contour abnormality the nailbed of the index finger on the oblique view. IMPRESSION: No acute osseous abnormality. Electronically Signed   By: Wiliam Ke M.D.   On: 12/14/2021 17:43        Scheduled Meds:  enoxaparin (LOVENOX) injection  40 mg Subcutaneous Q24H   insulin aspart  0-15 Units Subcutaneous TID WC   insulin aspart  0-5 Units Subcutaneous QHS   sodium chloride flush  3 mL Intravenous Q12H   Continuous Infusions:   ceFAZolin (ANCEF) IV 2 g (12/16/21 1310)     LOS: 1 day    Time spent: 35 minutes    Dorcas Carrow, MD [facility] Pager [phone]

## 2021-12-16 NOTE — Consult Note (Signed)
[facility]  Total days of antibiotics 2              Reason for Consult: staph lugdunensis bacteremia and left hand infection    Referring Physician: ghimire  Principal Problem:   Cellulitis of left hand Active Problems:   Hyponatremia   Uncontrolled type 2 diabetes mellitus with hyperglycemia, without long-term current use of insulin (HCC)   Leukocytosis   Conjunctivitis    HPI: Bryan Mueller is a 51 y.o. male with hx of T2DM, works as a Investment banker, operational at [employer], started to notice a pimple to left palm of hand, interdigineous area between 2nd and 3rd digit are that became increasing swollen and painful. He denies any recent cuts to the area nor trying to pop the blister developing. He also subscribed to some chills. His hand had noticeable swelling to dorsum of hand thus came to ED to evaluate since it persisted over the last 3 days prior to admit. He has has cuts to hands in the past due to his profession. On admit, he was afebrile, but did have leukocytosis of 16K. In the ED they did ultrasound that did not show any fluid collections. He was empirically started on cefazolin. His infectious work up - blood cx + s.lugdunensis in 1 of 2 sets. After 24hrs of iv abtx and iv fluids, he is feeling that is is improving slightly. He was seen by ortho who did not feel he needed surgical intervention at the moment.  Past Medical History:  Diagnosis Date   Cellulitis    Diabetes mellitus, type 2 (HCC)     Allergies: No Known Allergies  MEDICATIONS:  enoxaparin (LOVENOX) injection  40 mg Subcutaneous Q24H   insulin aspart  0-15 Units Subcutaneous TID WC   insulin aspart  0-5 Units Subcutaneous QHS   sodium chloride flush  3 mL Intravenous Q12H    Social History   Tobacco Use   Smoking status: Never   Smokeless tobacco: Current    Types: Chew  Vaping Use   Vaping Use: Never used  Substance Use Topics   Alcohol use: Not Currently   Drug use: No     No family history on file.  Review of Systems -   Constitutional: Negative for fever, but + chills, diaphoresis, activity change, appetite change, fatigue and unexpected weight change.  HENT: Negative for congestion, sore throat, rhinorrhea, sneezing, trouble swallowing and sinus pressure.  Eyes: Negative for photophobia and visual disturbance.  Respiratory: Negative for cough, chest tightness, shortness of breath, wheezing and stridor.  Cardiovascular: Negative for chest pain, palpitations and leg swelling.  Gastrointestinal: Negative for nausea, vomiting, abdominal pain, diarrhea, constipation, blood in stool, abdominal distention and anal bleeding.  Genitourinary: Negative for dysuria, hematuria, flank pain and difficulty urinating.  Musculoskeletal: Negative for myalgias, back pain, joint swelling, arthralgias and gait problem.  Skin:+left swollen, red hand.  Negative for color change, pallor, rash and wound.  Neurological: Negative for dizziness, tremors, weakness and light-headedness.  Hematological: Negative for adenopathy. Does not bruise/bleed easily.  Psychiatric/Behavioral: Negative for behavioral problems, confusion, sleep disturbance, dysphoric mood, decreased concentration and agitation.    OBJECTIVE: Temp:  [98.5 F (36.9 C)-98.6 F (37 C)] 98.5 F (36.9 C) (08/10 0815) Pulse Rate:  [86-93] 93 (08/10 0815) Resp:  [12-15] 12 (08/10 0815) BP: (116-133)/(80-81) 116/81 (08/10 0815) SpO2:  [98 %-100 %] 98 % (08/10 0815) Physical Exam  Constitutional: He is oriented to person, place, and time. He  appears well-developed and well-nourished. No distress.  HENT:  Mouth/Throat: Oropharynx is clear and moist. No oropharyngeal exudate.  Cardiovascular: Normal rate, regular rhythm and normal heart sounds. Exam reveals no gallop and no friction rub.  No murmur heard.  Pulmonary/Chest: Effort normal and breath sounds normal. No respiratory distress. He has no wheezes.   Abdominal: Soft. Bowel sounds are normal. He exhibits no distension. There is no tenderness.  Lymphadenopathy:  He has no cervical adenopathy.  Neurological: He is alert and oriented to person, place, and time.  Skin: Skin is warm and dry. Flaky dermatitis to forearms. Left hand swollen and erythematous, no drainage Psychiatric: He has a normal mood and affect. His behavior is normal.    LABS: Results for orders placed or performed during the hospital encounter of 12/14/21 (from the past 48 hour(s))  Comprehensive metabolic panel     Status: Abnormal   Collection Time: 12/14/21  4:55 PM  Result Value Ref Range   Sodium 132 (L) 135 - 145 mmol/L   Potassium 4.2 3.5 - 5.1 mmol/L   Chloride 98 98 - 111 mmol/L   CO2 26 22 - 32 mmol/L   Glucose, Bld 449 (H) 70 - 99 mg/dL    Comment: Glucose reference range applies only to samples taken after fasting for at least 8 hours.   BUN 14 6 - 20 mg/dL   Creatinine, Ser 1.610.76 0.61 - 1.24 mg/dL   Calcium 9.1 8.9 - 09.610.3 mg/dL   Total Protein 7.1 6.5 - 8.1 g/dL   Albumin 3.3 (L) 3.5 - 5.0 g/dL   AST 14 (L) 15 - 41 U/L   ALT 13 0 - 44 U/L   Alkaline Phosphatase 159 (H) 38 - 126 U/L   Total Bilirubin 0.4 0.3 - 1.2 mg/dL   GFR, Estimated >04>60 >54>60 mL/min    Comment: (NOTE) Calculated using the CKD-EPI Creatinine Equation (2021)    Anion gap 8 5 - 15    Comment: Performed at [facility], [address] [place], [place] [address]  CBC with Differential     Status: Abnormal   Collection Time: 12/14/21  4:55 PM  Result Value Ref Range   WBC 16.0 (H) 4.0 - 10.5 K/uL   RBC 5.02 4.22 - 5.81 MIL/uL   Hemoglobin 15.7 13.0 - 17.0 g/dL   HCT 91.444.2 78.239.0 - 95.652.0 %   MCV 88.0 80.0 - 100.0 fL   MCH 31.3 26.0 - 34.0 pg   MCHC 35.5 30.0 - 36.0 g/dL   RDW 21.312.1 08.611.5 - 57.815.5 %   Platelets 221 150 - 400 K/uL   nRBC 0.0 0.0 - 0.2 %   Neutrophils Relative % 79 %   Neutro Abs 12.7 (H) 1.7 - 7.7 K/uL   Lymphocytes Relative 11 %   Lymphs Abs 1.8 0.7 - 4.0 K/uL    Monocytes Relative 7 %   Monocytes Absolute 1.1 (H) 0.1 - 1.0 K/uL   Eosinophils Relative 1 %   Eosinophils Absolute 0.2 0.0 - 0.5 K/uL   Basophils Relative 1 %   Basophils Absolute 0.1 0.0 - 0.1 K/uL   Immature Granulocytes 1 %   Abs Immature Granulocytes 0.10 (H) 0.00 - 0.07 K/uL    Comment: Performed at [facility], [address] [place], [place] [address]  Lactic acid, plasma     Status: None   Collection Time: 12/15/21  6:06 AM  Result Value Ref Range   Lactic Acid, Venous 1.4 0.5 - 1.9  mmol/L    Comment: Performed at [facility], [address], [place], [place] [address]  Blood culture (routine x 2)     Status: None (Preliminary result)   Collection Time: 12/15/21  6:06 AM   Specimen: BLOOD LEFT FOREARM  Result Value Ref Range   Specimen Description BLOOD LEFT FOREARM    Special Requests      BOTTLES DRAWN AEROBIC AND ANAEROBIC Blood Culture adequate volume   Culture  Setup Time      GRAM POSITIVE COCCI IN CLUSTERS IN BOTH AEROBIC AND ANAEROBIC BOTTLES CRITICAL RESULT CALLED TO, READ BACK BY AND VERIFIED WITH: PHARMD A. PAYTES [id number] @0806  FH Performed at [facility], [address] [place], [place]    Culture GRAM POSITIVE COCCI    Report Status PENDING   Blood Culture ID Panel (Reflexed)     Status: Abnormal   Collection Time: 12/15/21  6:06 AM  Result Value Ref Range   Enterococcus faecalis NOT DETECTED NOT DETECTED   Enterococcus Faecium NOT DETECTED NOT DETECTED   Listeria monocytogenes NOT DETECTED NOT DETECTED   Staphylococcus species DETECTED (A) NOT DETECTED    Comment: CRITICAL RESULT CALLED TO, READ BACK BY AND VERIFIED WITH: PHARMD A. PAYTES 02/14/22 @0807  FH    Staphylococcus aureus (BCID) NOT DETECTED NOT DETECTED   Staphylococcus epidermidis NOT DETECTED NOT DETECTED   Staphylococcus lugdunensis DETECTED (A) NOT DETECTED    Comment: CRITICAL RESULT CALLED TO, READ BACK BY AND VERIFIED WITH: PHARMD A. PAYTES [id number]  @0807  FH    Streptococcus species NOT DETECTED NOT DETECTED   Streptococcus agalactiae NOT DETECTED NOT DETECTED   Streptococcus pneumoniae NOT DETECTED NOT DETECTED   Streptococcus pyogenes NOT DETECTED NOT DETECTED   A.calcoaceticus-baumannii NOT DETECTED NOT DETECTED   Bacteroides fragilis NOT DETECTED NOT DETECTED   Enterobacterales NOT DETECTED NOT DETECTED   Enterobacter cloacae complex NOT DETECTED NOT DETECTED   Escherichia coli NOT DETECTED NOT DETECTED   Klebsiella aerogenes NOT DETECTED NOT DETECTED   Klebsiella oxytoca NOT DETECTED NOT DETECTED   Klebsiella pneumoniae NOT DETECTED NOT DETECTED   Proteus species NOT DETECTED NOT DETECTED   Salmonella species NOT DETECTED NOT DETECTED   Serratia marcescens NOT DETECTED NOT DETECTED   Haemophilus influenzae NOT DETECTED NOT DETECTED   Neisseria meningitidis NOT DETECTED NOT DETECTED   Pseudomonas aeruginosa NOT DETECTED NOT DETECTED   Stenotrophomonas maltophilia NOT DETECTED NOT DETECTED   Candida albicans NOT DETECTED NOT DETECTED   Candida auris NOT DETECTED NOT DETECTED   Candida glabrata NOT DETECTED NOT DETECTED   Candida krusei NOT DETECTED NOT DETECTED   Candida parapsilosis NOT DETECTED NOT DETECTED   Candida tropicalis NOT DETECTED NOT DETECTED   Cryptococcus neoformans/gattii NOT DETECTED NOT DETECTED   Methicillin resistance mecA/C NOT DETECTED NOT DETECTED    Comment: Performed at [facility], [address] [place], [facility] [address]  Blood culture (routine x 2)     Status: None (Preliminary result)   Collection Time: 12/15/21  6:07 AM   Specimen: BLOOD RIGHT FOREARM  Result Value Ref Range   Specimen Description BLOOD RIGHT FOREARM    Special Requests      BOTTLES DRAWN AEROBIC AND ANAEROBIC Blood Culture results may not be optimal due to an inadequate volume of blood received in culture bottles   Culture      NO GROWTH < 24 HOURS Performed at [facility], [address], [place],  [facility] [address]  Report Status PENDING   CBG monitoring, ED     Status: Abnormal   Collection Time: 12/15/21  8:48 AM  Result Value Ref Range   Glucose-Capillary 258 (H) 70 - 99 mg/dL    Comment: Glucose reference range applies only to samples taken after fasting for at least 8 hours.  HIV Antibody (routine testing w rflx)     Status: None   Collection Time: 12/15/21  8:49 AM  Result Value Ref Range   HIV Screen 4th Generation wRfx Non Reactive Non Reactive    Comment: Performed at [facility], [address], [place], [place] [address]  Hemoglobin A1c     Status: Abnormal   Collection Time: 12/15/21  8:49 AM  Result Value Ref Range   Hgb A1c MFr Bld 9.9 (H) 4.8 - 5.6 %    Comment: (NOTE) Pre diabetes:          5.7%-6.4%  Diabetes:              >6.4%  Glycemic control for   <7.0% adults with diabetes    Mean Plasma Glucose 237.43 mg/dL    Comment: Performed at [facility], [address], [place], [place] [address]  Sedimentation rate     Status: Abnormal   Collection Time: 12/15/21  8:49 AM  Result Value Ref Range   Sed Rate 30 (H) 0 - 16 mm/hr    Comment: Performed at [facility], [address], [place], [place] [address]  C-reactive protein     Status: Abnormal   Collection Time: 12/15/21  8:49 AM  Result Value Ref Range   CRP 5.7 (H) <1.0 mg/dL    Comment: Performed at [facility], [address], [place], [place] [address]  Glucose, capillary     Status: Abnormal   Collection Time: 12/15/21 12:51 PM  Result Value Ref Range   Glucose-Capillary 196 (H) 70 - 99 mg/dL    Comment: Glucose reference range applies only to samples taken after fasting for at least 8 hours.  Glucose, capillary     Status: Abnormal   Collection Time: 12/15/21  4:07 PM  Result Value Ref Range   Glucose-Capillary 180 (H) 70 - 99 mg/dL    Comment: Glucose reference range applies only to samples taken after fasting for at least 8 hours.  Glucose, capillary     Status:  Abnormal   Collection Time: 12/15/21  9:07 PM  Result Value Ref Range   Glucose-Capillary 163 (H) 70 - 99 mg/dL    Comment: Glucose reference range applies only to samples taken after fasting for at least 8 hours.  CBC     Status: Abnormal   Collection Time: 12/16/21  5:45 AM  Result Value Ref Range   WBC 13.5 (H) 4.0 - 10.5 K/uL   RBC 4.79 4.22 - 5.81 MIL/uL   Hemoglobin 14.7 13.0 - 17.0 g/dL   HCT 46.6 59.9 - 35.7 %   MCV 88.1 80.0 - 100.0 fL   MCH 30.7 26.0 - 34.0 pg   MCHC 34.8 30.0 - 36.0 g/dL   RDW 01.7 79.3 - 90.3 %   Platelets 211 150 - 400 K/uL   nRBC 0.0 0.0 - 0.2 %    Comment: Performed at [facility], [address], [place], [place] [address]  Basic metabolic panel     Status: Abnormal   Collection Time: 12/16/21  5:45 AM  Result Value Ref Range  Sodium 134 (L) 135 - 145 mmol/L   Potassium 4.0 3.5 - 5.1 mmol/L   Chloride 100 98 - 111 mmol/L   CO2 25 22 - 32 mmol/L   Glucose, Bld 228 (H) 70 - 99 mg/dL    Comment: Glucose reference range applies only to samples taken after fasting for at least 8 hours.   BUN 11 6 - 20 mg/dL   Creatinine, Ser 2.45 0.61 - 1.24 mg/dL   Calcium 8.5 (L) 8.9 - 10.3 mg/dL   GFR, Estimated >80 >99 mL/min    Comment: (NOTE) Calculated using the CKD-EPI Creatinine Equation (2021)    Anion gap 9 5 - 15    Comment: Performed at [facility], [address], [place], [place] [address]  Glucose, capillary     Status: Abnormal   Collection Time: 12/16/21  8:17 AM  Result Value Ref Range   Glucose-Capillary 246 (H) 70 - 99 mg/dL    Comment: Glucose reference range applies only to samples taken after fasting for at least 8 hours.  Glucose, capillary     Status: Abnormal   Collection Time: 12/16/21 12:10 PM  Result Value Ref Range   Glucose-Capillary 168 (H) 70 - 99 mg/dL    Comment: Glucose reference range applies only to samples taken after fasting for at least 8 hours.    MICRO: 8/9 blood cx + s.lugdunensis by  BCID IMAGING: DG Hand Complete Left  Result Date: 12/14/2021 CLINICAL DATA:  Left hand infection EXAM: LEFT HAND - COMPLETE 3+ VIEW COMPARISON:  None Available. FINDINGS: No acute fracture or dislocation.The joint spaces are preserved.Alignment is unremarkable.No foreign body. Possible contour abnormality the nailbed of the index finger on the oblique view. IMPRESSION: No acute osseous abnormality. Electronically Signed   By: Wiliam Ke M.D.   On: 12/14/2021 17:43     Assessment/Plan:  51yo M with left hand SSTI/cellulitis with secondary bacteremia with staph lugdunensis, common skin flora associated with SSIT/abscess - recommend repeat blood cx today - continue with cefazolin 2gm IV Q 8 hr - continue to watch for improvement, if not, may need mri to left hand to see if having abscess development - due to potential virulence, will recommend to get TTE T2DM = recommend to check hgba1c to see if needs adjustment to home medications  Leukocytosis = anticipate to trend downward with abtx.

## 2021-12-16 NOTE — Progress Notes (Signed)
Patient ID: Bryan Mueller, male   DOB: [birth year], 51 y.o.   MRN: [id number]   LOS: 1 day   Subjective: Greatly improved   Objective: Vital signs in last 24 hours: Temp:  [98.5 F (36.9 C)-98.6 F (37 C)] 98.5 F (36.9 C) (08/10 0815) Pulse Rate:  [86-93] 93 (08/10 0815) Resp:  [12-15] 12 (08/10 0815) BP: (116-133)/(80-83) 116/81 (08/10 0815) SpO2:  [98 %-100 %] 98 % (08/10 0815) Weight:  [71.7 kg] 71.7 kg (08/09 1256) Last BM Date : 12/14/21 (PTA)   Laboratory  CBC Recent Labs    12/14/21 1655 12/16/21 0545  WBC 16.0* 13.5*  HGB 15.7 14.7  HCT 44.2 42.2  PLT 221 211   BMET Recent Labs    12/14/21 1655 12/16/21 0545  NA 132* 134*  K 4.2 4.0  CL 98 100  CO2 26 25  GLUCOSE 449* 228*  BUN 14 11  CREATININE 0.76 0.64  CALCIUM 9.1 8.5*     Physical Exam General appearance: alert and no distress Left hand: Swelling, erythema improved. AROM improved.   Assessment/Plan: Left hand cellulitis -- No indication for surgical intervention. Hand surgery will sign off.    Freeman Caldron, PA-C Orthopedic Surgery [phone] 12/16/2021

## 2021-12-16 NOTE — TOC Initial Note (Signed)
Transition of Care [facility]) - Initial/Assessment Note    Patient Details  Name: Bryan Mueller MRN: [id number] Date of Birth: 1970-11-25  Transition of [facility]) CM/SW Contact:    Curlene Labrum, RN Phone Number: 12/16/2021, 10:48 AM  Clinical Narrative:                 CM met with the patient at the bedside to discuss TOC needs.  The patient has cellulitis of Left hand and has pending evaluation by hand surgery at this time.  The patient has No Insurance provider the will need discharge medications sent through [facility] and possible MATCH needs.  The patient has no PCP at this time and referral placed in the discharge instructions for follow up with [facility] and [facility].  The patient's wife, present in room, is able to provide transportation for the patient.  CM will continue to follow the patient for discharge needs for home - no pending discharge date at this time.  Expected Discharge Plan: Home/Self Care Barriers to Discharge: Continued Medical Work up   Patient Goals and CMS Choice Patient states their goals for this hospitalization and ongoing recovery are:: To return home CMS Medicare.gov Compare Post Acute Care list provided to:: Patient Choice offered to / list presented to : Patient  Expected Discharge Plan and Services Expected Discharge Plan: Home/Self Care In-house Referral: PCP / [facility] Discharge Planning Services: Medication Assistance, [facility]   Living arrangements for the past 2 months: Single Family Home                                      Prior Living Arrangements/Services Living arrangements for the past 2 months: Single Family Home Lives with:: Spouse Patient language and need for interpreter reviewed:: Yes Do you feel safe going back to the place where you live?: Yes      Need for Family Participation in Patient Care: Yes (Comment) Care giver support system in place?: Yes (comment)   Criminal  Activity/Legal Involvement Pertinent to Current Situation/Hospitalization: No - Comment as needed  Activities of Daily Living Home Assistive Devices/Equipment: None ADL Screening (condition at time of admission) Patient's cognitive ability adequate to safely complete daily activities?: Yes Is the patient deaf or have difficulty hearing?: No Does the patient have difficulty seeing, even when wearing glasses/contacts?: No Does the patient have difficulty concentrating, remembering, or making decisions?: No Patient able to express need for assistance with ADLs?: No Does the patient have difficulty dressing or bathing?: No Independently performs ADLs?: Yes (appropriate for developmental age) Does the patient have difficulty walking or climbing stairs?: No Weakness of Legs: None Weakness of Arms/Hands: None  Permission Sought/Granted Permission sought to share information with : Case Manager Permission granted to share information with : Yes, Verbal Permission Granted     Permission granted to share info w AGENCY: [facility] pharmacy  Permission granted to share info w Relationship: Bryan Mueller - wife - [phone]     Emotional Assessment Appearance:: Appears stated age Attitude/Demeanor/Rapport: Gracious Affect (typically observed): Accepting Orientation: : Oriented to Self, Oriented to Place, Oriented to  Time, Oriented to Situation Alcohol / Substance Use: Tobacco Use Psych Involvement: No (comment)  Admission diagnosis:  Cellulitis of left hand [L03.114] Patient Active Problem List   Diagnosis Date Noted   Cellulitis of left hand 12/15/2021   Uncontrolled type 2 diabetes mellitus with hyperglycemia, without  long-term current use of insulin (HCC) 12/15/2021   Leukocytosis 12/15/2021   Conjunctivitis 12/15/2021   Lobar pneumonia (HCC) 09/07/2014   Hyponatremia 09/07/2014   Thrombocytopenia (HCC) 09/07/2014   Transaminasemia 09/07/2014   PCP:  Patient, No Pcp Per Pharmacy:    [id number] - [place], [place] - [address] [place] [address] Phone: [phone] Fax: [phone]  [facility] [address] [place] [address] Phone: [phone] Fax: [phone]     Social Determinants of Health (SDOH) Interventions    Readmission Risk Interventions    12/16/2021   10:46 AM  Readmission Risk Prevention Plan  Post Dischage Appt Complete  Medication Screening Complete  Transportation Screening Complete     

## 2021-12-16 NOTE — Progress Notes (Signed)
PHARMACY - PHYSICIAN COMMUNICATION CRITICAL VALUE ALERT - BLOOD CULTURE IDENTIFICATION (BCID)   Assessment:  Bryan Mueller is an 51 y.o. male who presented to [facility] on 12/14/2021 with a chief complaint of left hand redness and pain. Patient denies fevers, chills, sweats, N/V. Ortho and hand surgery consulted and wishing to see how patient responds to antibiotic therapy.   Patient is now growing 1/3 bottles (anaerobic) gram positive cocci in clusters, identified as Staphylococcus lugdenensis with no resistance mechanisms detected.   Name of physician (or Provider) Contacted: Dorcas Carrow, Odette Fraction, Judyann Munson   Current antibiotics: Cefazolin 2g IV Q8H   Changes to prescribed antibiotics recommended:  Patient is on recommended antibiotics - No changes needed  Results for orders placed or performed during the hospital encounter of 12/14/21  Blood Culture ID Panel (Reflexed) (Collected: 12/15/2021  6:06 AM)  Result Value Ref Range   Enterococcus faecalis NOT DETECTED NOT DETECTED   Enterococcus Faecium NOT DETECTED NOT DETECTED   Listeria monocytogenes NOT DETECTED NOT DETECTED   Staphylococcus species DETECTED (A) NOT DETECTED   Staphylococcus aureus (BCID) NOT DETECTED NOT DETECTED   Staphylococcus epidermidis NOT DETECTED NOT DETECTED   Staphylococcus lugdunensis DETECTED (A) NOT DETECTED   Streptococcus species NOT DETECTED NOT DETECTED   Streptococcus agalactiae NOT DETECTED NOT DETECTED   Streptococcus pneumoniae NOT DETECTED NOT DETECTED   Streptococcus pyogenes NOT DETECTED NOT DETECTED   A.calcoaceticus-baumannii NOT DETECTED NOT DETECTED   Bacteroides fragilis NOT DETECTED NOT DETECTED   Enterobacterales NOT DETECTED NOT DETECTED   Enterobacter cloacae complex NOT DETECTED NOT DETECTED   Escherichia coli NOT DETECTED NOT DETECTED   Klebsiella aerogenes NOT DETECTED NOT DETECTED   Klebsiella oxytoca NOT DETECTED NOT DETECTED   Klebsiella pneumoniae NOT  DETECTED NOT DETECTED   Proteus species NOT DETECTED NOT DETECTED   Salmonella species NOT DETECTED NOT DETECTED   Serratia marcescens NOT DETECTED NOT DETECTED   Haemophilus influenzae NOT DETECTED NOT DETECTED   Neisseria meningitidis NOT DETECTED NOT DETECTED   Pseudomonas aeruginosa NOT DETECTED NOT DETECTED   Stenotrophomonas maltophilia NOT DETECTED NOT DETECTED   Candida albicans NOT DETECTED NOT DETECTED   Candida auris NOT DETECTED NOT DETECTED   Candida glabrata NOT DETECTED NOT DETECTED   Candida krusei NOT DETECTED NOT DETECTED   Candida parapsilosis NOT DETECTED NOT DETECTED   Candida tropicalis NOT DETECTED NOT DETECTED   Cryptococcus neoformans/gattii NOT DETECTED NOT DETECTED   Methicillin resistance mecA/C NOT DETECTED NOT DETECTED    Jani Gravel, PharmD PGY-2 Infectious Diseases Resident  12/16/2021 8:24 AM

## 2021-12-17 ENCOUNTER — Other Ambulatory Visit (HOSPITAL_COMMUNITY): Payer: Self-pay

## 2021-12-17 ENCOUNTER — Inpatient Hospital Stay (HOSPITAL_COMMUNITY): Payer: Self-pay

## 2021-12-17 DIAGNOSIS — R7881 Bacteremia: Secondary | ICD-10-CM

## 2021-12-17 LAB — CBC WITH DIFFERENTIAL/PLATELET
Abs Immature Granulocytes: 0.06 10*3/uL (ref 0.00–0.07)
Basophils Absolute: 0.1 10*3/uL (ref 0.0–0.1)
Basophils Relative: 1 %
Eosinophils Absolute: 0.2 10*3/uL (ref 0.0–0.5)
Eosinophils Relative: 2 %
HCT: 42 % (ref 39.0–52.0)
Hemoglobin: 15 g/dL (ref 13.0–17.0)
Immature Granulocytes: 1 %
Lymphocytes Relative: 17 %
Lymphs Abs: 2 10*3/uL (ref 0.7–4.0)
MCH: 31.3 pg (ref 26.0–34.0)
MCHC: 35.7 g/dL (ref 30.0–36.0)
MCV: 87.7 fL (ref 80.0–100.0)
Monocytes Absolute: 1 10*3/uL (ref 0.1–1.0)
Monocytes Relative: 8 %
Neutro Abs: 8.9 10*3/uL — ABNORMAL HIGH (ref 1.7–7.7)
Neutrophils Relative %: 71 %
Platelets: 201 10*3/uL (ref 150–400)
RBC: 4.79 MIL/uL (ref 4.22–5.81)
RDW: 11.7 % (ref 11.5–15.5)
WBC: 12.3 10*3/uL — ABNORMAL HIGH (ref 4.0–10.5)
nRBC: 0 % (ref 0.0–0.2)

## 2021-12-17 LAB — ECHOCARDIOGRAM COMPLETE
Area-P 1/2: 4.01 cm2
Calc EF: 68.3 %
Height: 66 in
S' Lateral: 2.8 cm
Single Plane A2C EF: 71.5 %
Single Plane A4C EF: 66.7 %
Weight: 2529.12 oz

## 2021-12-17 LAB — GLUCOSE, CAPILLARY
Glucose-Capillary: 260 mg/dL — ABNORMAL HIGH (ref 70–99)
Glucose-Capillary: 208 mg/dL — ABNORMAL HIGH (ref 70–99)
Glucose-Capillary: 141 mg/dL — ABNORMAL HIGH (ref 70–99)
Glucose-Capillary: 147 mg/dL — ABNORMAL HIGH (ref 70–99)

## 2021-12-17 MED ORDER — BLOOD GLUCOSE METER KIT
PACK | 0 refills | Status: AC
  Filled 2021-12-17: qty 1, 30d supply, fill #0

## 2021-12-17 MED ORDER — GLIPIZIDE 5 MG PO TABS
5.0000 mg | ORAL_TABLET | Freq: Two times a day (BID) | ORAL | 1 refills | Status: DC
  Filled 2021-12-17: qty 60, 30d supply, fill #0

## 2021-12-17 MED ORDER — CEFADROXIL 500 MG PO CAPS
1000.0000 mg | ORAL_CAPSULE | Freq: Two times a day (BID) | ORAL | 0 refills | Status: AC
  Filled 2021-12-17: qty 40, 10d supply, fill #0

## 2021-12-17 MED ORDER — ACCU-CHEK SOFTCLIX LANCETS MISC
0 refills | Status: DC
  Filled 2021-12-17: qty 100, 25d supply, fill #0

## 2021-12-17 MED ORDER — INSULIN PEN NEEDLE 32G X 4 MM MISC
0 refills | Status: DC
  Filled 2021-12-17 (×2): qty 100, 30d supply, fill #0

## 2021-12-17 MED ORDER — METFORMIN HCL 1000 MG PO TABS: 1000.0000 mg | ORAL_TABLET | Freq: Two times a day (BID) | ORAL | 0 refills | Status: DC

## 2021-12-17 MED ORDER — GLUCOSE BLOOD VI STRP
ORAL_STRIP | 0 refills | Status: DC
  Filled 2021-12-17: qty 100, 25d supply, fill #0

## 2021-12-17 MED ORDER — LIVING WELL WITH DIABETES BOOK
Freq: Once | Status: AC
  Filled 2021-12-17: qty 1

## 2021-12-17 NOTE — Progress Notes (Signed)
    [facility]    Date of Admission:  12/14/2021   Total days of antibiotics 3 1/3 days cefazolin          ID: Bryan Mueller is a 51 y.o. male with  left hand SSTI with secondary s.lugdunensis bacteremia Principal Problem:   Cellulitis of left hand Active Problems:   Hyponatremia   Uncontrolled type 2 diabetes mellitus with hyperglycemia, without long-term current use of insulin (HCC)   Leukocytosis   Conjunctivitis    Subjective: Afebrile, continues to have slight improvement in swelling  Medications:   enoxaparin (LOVENOX) injection  40 mg Subcutaneous Q24H   insulin aspart  0-15 Units Subcutaneous TID WC   insulin aspart  0-5 Units Subcutaneous QHS   sodium chloride flush  3 mL Intravenous Q12H    Objective: Vital signs in last 24 hours: Temp:  [98.8 F (37.1 C)-99.2 F (37.3 C)] 98.8 F (37.1 C) (08/11 0801) Pulse Rate:  [74-85] 85 (08/11 0801) Resp:  [12-17] 12 (08/11 0801) BP: (112-120)/(65-76) 120/76 (08/11 0801) SpO2:  [100 %] 100 % (08/11 0801) Physical Exam  Constitutional: He is oriented to person, place, and time. He appears well-developed and well-nourished. No distress.  HENT:  Mouth/Throat: Oropharynx is clear and moist. No oropharyngeal exudate.  Cardiovascular: Normal rate, regular rhythm and normal heart sounds. Exam reveals no gallop and no friction rub.  No murmur heard.  Pulmonary/Chest: Effort normal and breath sounds normal. No respiratory distress. He has no wheezes.  HEN:IDPO hand dorsum still red and swollen but improved from 24hrs ago Neurological: He is alert and oriented to person, place, and time.  Skin: Skin is warm and dry. No rash noted. No erythema.  Psychiatric: He has a normal mood and affect. His behavior is normal.    Lab Results Recent Labs    12/14/21 1655 12/16/21 0545 12/17/21 1439  WBC 16.0* 13.5* 12.3*  HGB 15.7 14.7 15.0  HCT 44.2 42.2 42.0  NA 132* 134*  --   K 4.2 4.0  --   CL 98 100   --   CO2 26 25  --   BUN 14 11  --   CREATININE 0.76 0.64  --    Liver Panel Recent Labs    12/14/21 1655  PROT 7.1  ALBUMIN 3.3*  AST 14*  ALT 13  ALKPHOS 159*  BILITOT 0.4   Sedimentation Rate Recent Labs    12/15/21 0849  ESRSEDRATE 30*   C-Reactive Protein Recent Labs    12/15/21 0849  CRP 5.7*    Microbiology: 8/8 blood cx 2/2 s. Lugdunensis  Studies/Results: No results found.   Assessment/Plan: S.lugdunensis bacteremia related to left hand SSTI = continue on cefazolin while hospitalized. Not needing surgery at this time for debridement. Plan to discharge on addn 10 days of cefadroxil 1000mg  BID.  Repeat blood cx today  Leukocytosis = improving  We will see back in the ID clinic in 7 days to see that it is improving to decide if to need to extend abtx  [facility] Pager: [phone]  12/17/2021, 3:13 PM

## 2021-12-17 NOTE — Progress Notes (Signed)
PROGRESS NOTE    Bryan Mueller  WOE:[id number] DOB: June 05, 1970 DOA: 12/14/2021 PCP: Patient, No Pcp Per    Brief Narrative:  51 year old untreated type 2 diabetes presented with 3 days of left hand pain and swelling found to have spreading cellulitis and admitted to the hospital.  Blood sugars 449 on arrival.  X-ray without any abnormalities.  Bedside ultrasound without collection.  Hand surgery consulted and they recommended conservative management.   Assessment & Plan:   Left hand cellulitis and abscess in a patient with uncontrolled type 2 diabetes: Sepsis present on admission secondary to staphylococcal bacteremia: -Admitted to the hospital for IV antibiotics.  Continue Ancef today. -Blood cultures with Staphylococcus lugdunensis, no MRSA identified so we will continue Ancef today.  ID following.  Recheck blood cultures negative so far.  2D echocardiogram today. -Significant swelling of the hand, hand surgery following and at this time recommending conservative management with close follow-up.  Elevate affected hand.  Hot compress.  Type 2 diabetes, poorly controlled with hyperglycemia: Long time diagnosed.  Untreated.  Noncompliant. Will need PCP on discharge. Given insulin in the hospital perioperative. Consult dietitian, diet and lifestyle modification discussed.  Agreeable. Wants to go home on oral diabetic agents, will prescribe multiple diabetic agents on discharge with outpatient follow-up. Hemoglobin A1c 9.9. Prescription sent for glipizide and metformin to the [facility] pharmacy for charity medication.  Mobilize.  Left hand exercise. Anticipate discharge over the weekend, diabetic medications sent to [facility] pharmacy. Anticipate discharge on oral antibiotics, will discuss with infectious disease for prescriptions.   DVT prophylaxis: enoxaparin (LOVENOX) injection 40 mg Start: 12/15/21 1000   Code Status: Full code Family Communication: Wife at the bedside Disposition  Plan: Status is: Inpatient Remains inpatient appropriate because: IV antibiotics     Consultants:  Hand surgery ID  Procedures:  None  Antimicrobials:  Ancef 8/9---   Subjective: Seen and examined.  Pain and swelling is better.  Afebrile.  Objective: Vitals:   12/15/21 1917 12/16/21 0815 12/16/21 1948 12/17/21 0801  BP: 133/80 116/81 112/65 120/76  Pulse: 86 93 74 85  Resp: 15 12 17 12   Temp: 98.6 F (37 C) 98.5 F (36.9 C) 99.2 F (37.3 C) 98.8 F (37.1 C)  TempSrc: Oral  Oral Oral  SpO2: 99% 98% 100% 100%  Weight:      Height:        Intake/Output Summary (Last 24 hours) at 12/17/2021 1111 Last data filed at 12/17/2021 1002 Gross per 24 hour  Intake 120 ml  Output --  Net 120 ml   Filed Weights   12/15/21 1256  Weight: 71.7 kg    Examination:  General exam: Appears calm and comfortable  Respiratory system: Clear to auscultation. Respiratory effort normal. Cardiovascular system: S1 & S2 heard, RRR. No JVD, murmurs, rubs, gallops or clicks. No pedal edema. Gastrointestinal system: Abdomen is nondistended, soft and nontender. No organomegaly or masses felt. Normal bowel sounds heard. Central nervous system: Alert and oriented. No focal neurological deficits. Extremities: Symmetric 5 x 5 power. Skin:  Swelling and edema mostly on the dorsum of the left hand, receding inflammation.   Pitting without obvious fluctuation.   Swelling and mobility better than previous.    Data Reviewed: I have personally reviewed following labs and imaging studies  CBC: Recent Labs  Lab 12/14/21 1655 12/16/21 0545  WBC 16.0* 13.5*  NEUTROABS 12.7*  --   HGB 15.7 14.7  HCT 44.2 42.2  MCV 88.0 88.1  PLT 221 211  Basic Metabolic Panel: Recent Labs  Lab 12/14/21 1655 12/16/21 0545  NA 132* 134*  K 4.2 4.0  CL 98 100  CO2 26 25  GLUCOSE 449* 228*  BUN 14 11  CREATININE 0.76 0.64  CALCIUM 9.1 8.5*   GFR: Estimated Creatinine Clearance: 100.8 mL/min (by C-G  formula based on SCr of 0.64 mg/dL). Liver Function Tests: Recent Labs  Lab 12/14/21 1655  AST 14*  ALT 13  ALKPHOS 159*  BILITOT 0.4  PROT 7.1  ALBUMIN 3.3*   No results for input(s): "LIPASE", "AMYLASE" in the last 168 hours. No results for input(s): "AMMONIA" in the last 168 hours. Coagulation Profile: No results for input(s): "INR", "PROTIME" in the last 168 hours. Cardiac Enzymes: No results for input(s): "CKTOTAL", "CKMB", "CKMBINDEX", "TROPONINI" in the last 168 hours. BNP (last 3 results) No results for input(s): "PROBNP" in the last 8760 hours. HbA1C: Recent Labs    12/15/21 0849  HGBA1C 9.9*   CBG: Recent Labs  Lab 12/16/21 0817 12/16/21 1210 12/16/21 1703 12/16/21 2010 12/17/21 0803  GLUCAP 246* 168* 177* 228* 260*   Lipid Profile: No results for input(s): "CHOL", "HDL", "LDLCALC", "TRIG", "CHOLHDL", "LDLDIRECT" in the last 72 hours. Thyroid Function Tests: No results for input(s): "TSH", "T4TOTAL", "FREET4", "T3FREE", "THYROIDAB" in the last 72 hours. Anemia Panel: No results for input(s): "VITAMINB12", "FOLATE", "FERRITIN", "TIBC", "IRON", "RETICCTPCT" in the last 72 hours. Sepsis Labs: Recent Labs  Lab 12/15/21 0606  LATICACIDVEN 1.4    Recent Results (from the past 240 hour(s))  Blood culture (routine x 2)     Status: Abnormal (Preliminary result)   Collection Time: 12/15/21  6:06 AM   Specimen: BLOOD LEFT FOREARM  Result Value Ref Range Status   Specimen Description BLOOD LEFT FOREARM  Final   Special Requests   Final    BOTTLES DRAWN AEROBIC AND ANAEROBIC Blood Culture adequate volume   Culture  Setup Time   Final    GRAM POSITIVE COCCI IN CLUSTERS IN BOTH AEROBIC AND ANAEROBIC BOTTLES CRITICAL RESULT CALLED TO, READ BACK BY AND VERIFIED WITH: PHARMD A. PAYTES [id number] @0806  FH    Culture (A)  Final    STAPHYLOCOCCUS LUGDUNENSIS SUSCEPTIBILITIES TO FOLLOW Performed at [facility], [address], [place], [place]     Report Status PENDING  Incomplete  Blood Culture ID Panel (Reflexed)     Status: Abnormal   Collection Time: 12/15/21  6:06 AM  Result Value Ref Range Status   Enterococcus faecalis NOT DETECTED NOT DETECTED Final   Enterococcus Faecium NOT DETECTED NOT DETECTED Final   Listeria monocytogenes NOT DETECTED NOT DETECTED Final   Staphylococcus species DETECTED (A) NOT DETECTED Final    Comment: CRITICAL RESULT CALLED TO, READ BACK BY AND VERIFIED WITH: PHARMD A. PAYTES 02/14/22 @0807  FH    Staphylococcus aureus (BCID) NOT DETECTED NOT DETECTED Final   Staphylococcus epidermidis NOT DETECTED NOT DETECTED Final   Staphylococcus lugdunensis DETECTED (A) NOT DETECTED Final    Comment: CRITICAL RESULT CALLED TO, READ BACK BY AND VERIFIED WITH: PHARMD A. PAYTES [id number] @0807  FH    Streptococcus species NOT DETECTED NOT DETECTED Final   Streptococcus agalactiae NOT DETECTED NOT DETECTED Final   Streptococcus pneumoniae NOT DETECTED NOT DETECTED Final   Streptococcus pyogenes NOT DETECTED NOT DETECTED Final   A.calcoaceticus-baumannii NOT DETECTED NOT DETECTED Final   Bacteroides fragilis NOT DETECTED NOT DETECTED Final   Enterobacterales NOT DETECTED NOT DETECTED Final   Enterobacter cloacae complex NOT DETECTED  NOT DETECTED Final   Escherichia coli NOT DETECTED NOT DETECTED Final   Klebsiella aerogenes NOT DETECTED NOT DETECTED Final   Klebsiella oxytoca NOT DETECTED NOT DETECTED Final   Klebsiella pneumoniae NOT DETECTED NOT DETECTED Final   Proteus species NOT DETECTED NOT DETECTED Final   Salmonella species NOT DETECTED NOT DETECTED Final   Serratia marcescens NOT DETECTED NOT DETECTED Final   Haemophilus influenzae NOT DETECTED NOT DETECTED Final   Neisseria meningitidis NOT DETECTED NOT DETECTED Final   Pseudomonas aeruginosa NOT DETECTED NOT DETECTED Final   Stenotrophomonas maltophilia NOT DETECTED NOT DETECTED Final   Candida albicans NOT DETECTED NOT DETECTED Final   Candida auris  NOT DETECTED NOT DETECTED Final   Candida glabrata NOT DETECTED NOT DETECTED Final   Candida krusei NOT DETECTED NOT DETECTED Final   Candida parapsilosis NOT DETECTED NOT DETECTED Final   Candida tropicalis NOT DETECTED NOT DETECTED Final   Cryptococcus neoformans/gattii NOT DETECTED NOT DETECTED Final   Methicillin resistance mecA/C NOT DETECTED NOT DETECTED Final    Comment: Performed at [facility], [address], [place], [place] [address]  Blood culture (routine x 2)     Status: None (Preliminary result)   Collection Time: 12/15/21  6:07 AM   Specimen: BLOOD RIGHT FOREARM  Result Value Ref Range Status   Specimen Description BLOOD RIGHT FOREARM  Final   Special Requests   Final    BOTTLES DRAWN AEROBIC AND ANAEROBIC Blood Culture results may not be optimal due to an inadequate volume of blood received in culture bottles   Culture   Final    NO GROWTH 2 DAYS Performed at [facility], [address] [place], [place] [address]    Report Status PENDING  Incomplete         Radiology Studies: No results found.      Scheduled Meds:  enoxaparin (LOVENOX) injection  40 mg Subcutaneous Q24H   insulin aspart  0-15 Units Subcutaneous TID WC   insulin aspart  0-5 Units Subcutaneous QHS   sodium chloride flush  3 mL Intravenous Q12H   Continuous Infusions:   ceFAZolin (ANCEF) IV 2 g (12/17/21 0559)     LOS: 2 days    Time spent: 35 minutes    Dorcas Carrow, MD [facility] Pager [phone]

## 2021-12-17 NOTE — TOC Progression Note (Signed)
Transition of Care [facility]) - Progression Note    Patient Details  Name: Bryan Mueller MRN: [id number] Date of Birth: [birth year]  Transition of [facility] CM/SW Contact  Janae Bridgeman, RN Phone Number: 12/17/2021, 2:38 PM  Clinical Narrative:    CM called and spoke with attending physician, Dr. Jerral Ralph, and patient is not medically stable for discharge until tomorrow.  MATCH placed and pharmacy to fill discharge medications and hold in the main pharmacy.  Bedside nursing is aware and will contact pharmacy to deliver tomorrow when patient is likely discharged.  PCP appointment is set up.  The patient's wife will be providing transportation home by car.  No other TOC needs at this time.   Expected Discharge Plan: Home/Self Care Barriers to Discharge: Continued Medical Work up  Expected Discharge Plan and Services Expected Discharge Plan: Home/Self Care In-house Referral: PCP / [facility] Discharge Planning Services: Medication Assistance, [facility]igent [facility]   Living arrangements for the past 2 months: Single Family Home                                       Social Determinants of Health (SDOH) Interventions    Readmission Risk Interventions    12/16/2021   10:46 AM  Readmission Risk Prevention Plan  Post Dischage Appt Complete  Medication Screening Complete  Transportation Screening Complete

## 2021-12-17 NOTE — Progress Notes (Signed)
  Echocardiogram 2D Echocardiogram has been performed.  Bryan Mueller 12/17/2021, 3:48 PM

## 2021-12-17 NOTE — Plan of Care (Signed)

## 2021-12-17 NOTE — Progress Notes (Signed)
  RD consulted for nutrition education regarding diabetes. Was dx many years ago - per pt  Lab Results  Component Value Date   HGBA1C 9.9 (H) 12/15/2021   Pt resting in bed at the time of visit, family at bedside. Denies questions about diet at this time, but reviewed education with RD.  RD provided "Carbohydrate Counting for People with Diabetes" handout from the Academy of Nutrition and Dietetics. Discussed different food groups and their effects on blood sugar, emphasizing carbohydrate-containing foods. Provided list of carbohydrates and recommended serving sizes of common foods.  Discussed importance of controlled and consistent carbohydrate intake throughout the day. Pt reports skipping meals, discussed the importance of not doing this and provided with sample menu. Provided examples of ways to balance meals/snacks and encouraged intake of high-fiber, whole grain complex carbohydrates. Teach back method used.  Expect fair compliance.  Body mass index is 25.51 kg/m. Pt meets criteria for overweight based on current BMI.  Current diet order is carb modified, patient is consuming approximately 62% of meals at this time. Labs and medications reviewed. No further nutrition interventions warranted at this time. RD contact information provided. If additional nutrition issues arise, please re-consult RD.  Greig Castilla, RD, LDN Clinical Dietitian RD pager # available in [facility]  After hours/weekend pager # available in [facility]

## 2021-12-17 NOTE — Inpatient Diabetes Management (Signed)
Inpatient Diabetes Program Recommendations  AACE/ADA: New Consensus Statement on Inpatient Glycemic Control (2015)  Target Ranges:  Prepandial:   less than 140 mg/dL      Peak postprandial:   less than 180 mg/dL (1-2 hours)      Critically ill patients:  140 - 180 mg/dL   Lab Results  Component Value Date   GLUCAP 208 (H) 12/17/2021   HGBA1C 9.9 (H) 12/15/2021    Review of Glycemic Control  Diabetes history: type 2 Outpatient Diabetes medications: none Current orders for Inpatient glycemic control: Novolog 0-15 units TID  Inpatient Diabetes Program Recommendations:   Spoke with patient at the bedside. Patient states that he was diagnosed with diabetes in 2017. He states that he does not take any medications for diabetes and has not been checking blood sugars at home. States that he has not seen a physician. Discussed his HgbA1C of 9.9% and that his blood sugars have been running around 240 mg/dl on an average. Discussed normal blood sugar. States that he eats what he wants at home. Discussed the "plate method" and what is generally advised to eat.  Will order Living Well with Diabetes booklet from pharmacy. Discussed checking blood sugars at home and to follow up with a physician after discharge.   Smith Mince RN BSN CDE Diabetes Coordinator Pager: [phone]  8am-5pm

## 2021-12-18 ENCOUNTER — Other Ambulatory Visit: Payer: Self-pay | Admitting: Internal Medicine

## 2021-12-18 LAB — GLUCOSE, CAPILLARY: Glucose-Capillary: 218 mg/dL — ABNORMAL HIGH (ref 70–99)

## 2021-12-18 LAB — CBC WITH DIFFERENTIAL/PLATELET
Abs Immature Granulocytes: 0.06 10*3/uL (ref 0.00–0.07)
Basophils Absolute: 0.1 10*3/uL (ref 0.0–0.1)
Basophils Relative: 1 %
Eosinophils Absolute: 0.3 10*3/uL (ref 0.0–0.5)
Eosinophils Relative: 3 %
HCT: 40.8 % (ref 39.0–52.0)
Hemoglobin: 14.8 g/dL (ref 13.0–17.0)
Immature Granulocytes: 1 %
Lymphocytes Relative: 25 %
Lymphs Abs: 2.9 10*3/uL (ref 0.7–4.0)
MCH: 31.1 pg (ref 26.0–34.0)
MCHC: 36.3 g/dL — ABNORMAL HIGH (ref 30.0–36.0)
MCV: 85.7 fL (ref 80.0–100.0)
Monocytes Absolute: 1 10*3/uL (ref 0.1–1.0)
Monocytes Relative: 9 %
Neutro Abs: 7.2 10*3/uL (ref 1.7–7.7)
Neutrophils Relative %: 61 %
Platelets: 221 10*3/uL (ref 150–400)
RBC: 4.76 MIL/uL (ref 4.22–5.81)
RDW: 11.6 % (ref 11.5–15.5)
WBC: 11.6 10*3/uL — ABNORMAL HIGH (ref 4.0–10.5)
nRBC: 0 % (ref 0.0–0.2)

## 2021-12-18 LAB — BASIC METABOLIC PANEL
Anion gap: 10 (ref 5–15)
BUN: 9 mg/dL (ref 6–20)
CO2: 28 mmol/L (ref 22–32)
Calcium: 9 mg/dL (ref 8.9–10.3)
Chloride: 100 mmol/L (ref 98–111)
Creatinine, Ser: 0.68 mg/dL (ref 0.61–1.24)
GFR, Estimated: >60 mL/min (ref >60)
Glucose, Bld: 159 mg/dL — ABNORMAL HIGH (ref 70–99)
Potassium: 3.8 mmol/L (ref 3.5–5.1)
Sodium: 138 mmol/L (ref 135–145)

## 2021-12-18 LAB — CULTURE, BLOOD (ROUTINE X 2): Special Requests: ADEQUATE

## 2021-12-18 LAB — MAGNESIUM: Magnesium: 1.7 mg/dL (ref 1.7–2.4)

## 2021-12-18 MED ORDER — METFORMIN HCL 1000 MG PO TABS: 1000.0000 mg | ORAL_TABLET | Freq: Two times a day (BID) | ORAL | 0 refills | Status: DC

## 2021-12-18 NOTE — Discharge Summary (Signed)
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  Blood sugars 449 on arrival.  X-ray without any abnormalities.  Bedside ultrasound without collection.  Hand surgery consulted and they recommended conservative management.  Left hand cellulitis and abscess in a patient with uncontrolled type 2 diabetes.  Staphylococcus bacteremia. Sepsis present on admission secondary to staphylococcal bacteremia: -Admitted to the hospital for IV antibiotics.  Treated with Ancef for the last 4 days. -Blood cultures with Staphylococcus lugdunensis, no MRSA identified.  Repeat culture negative.  -2D echocardiogram essentially normal.  -Significant improvement with conservative management.  Surgery recommended against surgery.  As per ID recommendation, going home with 10 additional days of oral antibiotics.  Local therapies.   Type 2 diabetes, poorly controlled with hyperglycemia: Long time diagnosed.  Untreated.  Scheduled PCP for discharge. Diet lifestyle changes modification discussed.  Patient is agreeable to go on medications. Wants to go home on oral diabetic agents, will prescribe multiple diabetic agents on discharge with outpatient follow-up. Hemoglobin A1c 9.9. Discharge on glipizide and metformin.   Stable for discharge.     Discharge Diagnoses:  Principal Problem:   Cellulitis of left hand Active Problems:   Leukocytosis    Uncontrolled type 2 diabetes mellitus with hyperglycemia, without long-term current use of insulin (HCC)   Conjunctivitis   Hyponatremia    Discharge Instructions  Discharge Instructions     Call MD for:  severe uncontrolled pain   Complete by: As directed    Call MD for:  temperature >100.4   Complete by: As directed    Diet Carb Modified   Complete by: As directed    Discharge instructions   Complete by: As directed    Soak in warm water and exercise   Increase activity slowly   Complete by: As directed       Allergies as of 12/18/2021   No Known Allergies      Medication List     TAKE these medications    Accu-Chek Guide w/Device Kit Dispense based on patient and insurance preference. Use up to four times daily as directed. (FOR ICD-10 E10.9, E11.9).   cefadroxil 500 MG capsule Commonly known as: DURICEF Take 2 capsules (1,000 mg total) by mouth 2 (two) times daily for 10 days.   glipiZIDE 5 MG tablet Commonly known as: GLUCOTROL Take 1 tablet (5 mg total) by mouth 2 (two) times daily before a meal. What changed:  how much to take when to take this   metFORMIN 1000 MG tablet Commonly known as: GLUCOPHAGE Take 1 tablet (1,000 mg total) by mouth 2 (two) times daily with a meal. What changed:  medication strength how much to take   Pentips 32G X 4 MM Misc Generic drug: Insulin Pen Needle Use as directed        Follow-up Information     [name], NP Follow up on 12/30/2021.   Specialty: Internal Medicine Why: You are scheduled for a hospital follow up on December 30, 2021 at 9:30  am.  Please reschedule if you are unable to appear for the appointment for a hospital follow up. Contact information: [id number] [address] [phone]                No Known Allergies  Consultations: Hand surgery Infectious disease   Procedures/Studies: ECHOCARDIOGRAM COMPLETE  Result Date: 12/17/2021    ECHOCARDIOGRAM  REPORT   Patient Name:   Bryan Mueller Date of Exam: 12/17/2021 Medical Rec #:  [id number]      Height:       66.0 in Accession #:    [id number]     Weight:       158.1 lb Date of Birth:  30-Jun-1970       BSA:          1.810 m Patient Age:    58 years       BP:           120/76 mmHg Patient Gender: M              HR:           69 bpm. Exam Location:  Inpatient Procedure: 2D Echo, 3D Echo, Cardiac Doppler and Color Doppler Indications:    Bacteremia  History:        Patient has no prior history of Echocardiogram examinations.                 Risk Factors:Diabetes.  Sonographer:    Roseanna Rainbow RDCS Referring Phys: [id number] [place]  1. Left ventricular ejection fraction, by estimation, is 60 to 65%. Left ventricular ejection fraction by 3D volume is 63 %. The left ventricle has normal function. The left ventricle has no regional wall motion abnormalities. There is mild concentric left ventricular hypertrophy. Left ventricular diastolic parameters were normal.  2. Right ventricular systolic function is normal. The right ventricular size is normal.  3. The mitral valve is normal in structure. No evidence of mitral valve regurgitation. No evidence of mitral stenosis.  4. The aortic valve is tricuspid. Aortic valve regurgitation is not visualized. Aortic valve sclerosis/calcification is present, without any evidence of aortic stenosis.  5. The inferior vena cava is normal in size with greater than 50% respiratory variability, suggesting right atrial pressure of 3 mmHg. Conclusion(s)/Recommendation(s): No evidence of valvular vegetations on this transthoracic echocardiogram. Consider a transesophageal echocardiogram to exclude infective endocarditis if clinically indicated. FINDINGS  Left Ventricle: Left ventricular ejection fraction, by estimation, is 60 to 65%. Left ventricular ejection fraction by 3D volume is 63 %. The left ventricle has normal function. The left ventricle has no regional wall motion  abnormalities. The left ventricular internal cavity size was normal in size. There is mild concentric left ventricular hypertrophy. Left ventricular diastolic parameters were normal. Normal left ventricular filling pressure. Right Ventricle: The right ventricular size is normal. No increase in right ventricular wall thickness. Right ventricular systolic function is normal. Left Atrium: Left atrial size was normal in size. Right Atrium: Right atrial size was normal in size. Pericardium: There is no evidence of pericardial effusion. Mitral Valve: The mitral valve is normal in structure. No evidence of mitral valve regurgitation. No evidence of mitral valve stenosis. Tricuspid Valve: The tricuspid valve is normal in structure. Tricuspid valve regurgitation is not demonstrated. No evidence of tricuspid stenosis. Aortic Valve: The aortic valve is tricuspid. Aortic valve regurgitation is not visualized. Aortic valve sclerosis/calcification is present, without any evidence of aortic stenosis. Pulmonic Valve: The pulmonic valve was  normal in structure. Pulmonic valve regurgitation is not visualized. No evidence of pulmonic stenosis. Aorta: The aortic root is normal in size and structure. Venous: The inferior vena cava is normal in size with greater than 50% respiratory variability, suggesting right atrial pressure of 3 mmHg. IAS/Shunts: The interatrial septum appears to be lipomatous. No atrial level shunt detected by color flow Doppler.  LEFT VENTRICLE PLAX 2D LVIDd:         4.10 cm         Diastology LVIDs:         2.80 cm         LV e' medial:    8.49 cm/s LV PW:         1.20 cm         LV E/e' medial:  11.7 LV IVS:        1.20 cm         LV e' lateral:   9.57 cm/s LVOT diam:     2.20 cm         LV E/e' lateral: 10.4 LV SV:         75 LV SV Index:   41 LVOT Area:     3.80 cm        3D Volume EF                                LV 3D EF:    Left                                             ventricul LV Volumes (MOD)                             ar LV vol d, MOD    59.3 ml                    ejection A2C:                                        fraction LV vol d, MOD    58.8 ml                    by 3D A4C:                                        volume is LV vol s, MOD    16.9 ml                    63 %. A2C: LV vol s, MOD    19.6 ml A4C:                           3D Volume EF: LV SV MOD A2C:   42.4 ml       3D EF:        63 % LV SV MOD A4C:   58.8 ml       LV EDV:       97 ml LV SV MOD  BP:    40.7 ml       LV ESV:       36 ml                                LV SV:        60 ml RIGHT VENTRICLE             IVC RV S prime:     12.70 cm/s  IVC diam: 1.60 cm TAPSE (M-mode): 1.8 cm LEFT ATRIUM             Index        RIGHT ATRIUM           Index LA diam:        3.00 cm 1.66 cm/m   RA Area:     12.90 cm LA Vol (A2C):   35.1 ml 19.40 ml/m  RA Volume:   29.00 ml  16.03 ml/m LA Vol (A4C):   21.4 ml 11.83 ml/m LA Biplane Vol: 29.4 ml 16.25 ml/m  AORTIC VALVE LVOT Vmax:   111.00 cm/s LVOT Vmean:  65.500 cm/s LVOT VTI:    0.197 m  AORTA Ao Root diam: 3.40 cm Ao Asc diam:  3.20 cm MITRAL VALVE MV Area (PHT): 4.01 cm    SHUNTS MV Decel Time: 189 msec    Systemic VTI:  0.20 m MV E velocity: 99.20 cm/s  Systemic Diam: 2.20 cm MV A velocity: 74.77 cm/s MV E/A ratio:  1.33 Fransico Him MD Electronically signed by Fransico Him MD Signature Date/Time: 12/17/2021/3:57:22 PM    Final    DG Hand Complete Left  Result Date: 12/14/2021 CLINICAL DATA:  Left hand infection EXAM: LEFT HAND - COMPLETE 3+ VIEW COMPARISON:  None Available. FINDINGS: No acute fracture or dislocation.The joint spaces are preserved.Alignment is unremarkable.No foreign body. Possible contour abnormality the nailbed of the index finger on the oblique view. IMPRESSION: No acute osseous abnormality. Electronically Signed   By: Merilyn Baba M.D.   On: 12/14/2021 17:43   (Echo, Carotid, EGD, Colonoscopy, ERCP)    Subjective: Patient seen and examined.  Wife at the bedside.  Left  hand is much improved.  Able to make fist but is still somewhat tight.  Afebrile.  WBC count normal.  Repeat blood cultures negative.   Discharge Exam: Vitals:   12/17/21 1944 12/18/21 0732  BP: 113/77 104/74  Pulse: 78 83  Resp: 16 20  Temp: 98.6 F (37 C) 98.6 F (37 C)  SpO2: 99% 97%   Vitals:   12/16/21 1948 12/17/21 0801 12/17/21 1944 12/18/21 0732  BP: 112/65 120/76 113/77 104/74  Pulse: 74 85 78 83  Resp: '17 12 16 20  ' Temp: 99.2 F (37.3 C) 98.8 F (37.1 C) 98.6 F (37 C) 98.6 F (37 C)  TempSrc: Oral Oral Oral Oral  SpO2: 100% 100% 99% 97%  Weight:      Height:        General: Pt is alert, awake, not in acute distress Cardiovascular: RRR, S1/S2 +, no rubs, no gallops Respiratory: CTA bilaterally, no wheezing, no rhonchi Abdominal: Soft, NT, ND, bowel sounds + Extremities:  Left hand with some swelling on the dorsum, wrinkling, pitting without abscess.    The results of significant diagnostics from this hospitalization (including imaging, microbiology, ancillary and laboratory) are listed below for reference.     Microbiology: Recent Results (from the past 240 hour(s))  Blood culture (routine x 2)     Status: Abnormal (Preliminary result)   Collection Time: 12/15/21  6:06 AM   Specimen: BLOOD LEFT FOREARM  Result Value Ref Range Status   Specimen Description BLOOD LEFT FOREARM  Final   Special Requests   Final    BOTTLES DRAWN AEROBIC AND ANAEROBIC Blood Culture adequate volume   Culture  Setup Time   Final    GRAM POSITIVE COCCI IN CLUSTERS IN BOTH AEROBIC AND ANAEROBIC BOTTLES CRITICAL RESULT CALLED TO, READ BACK BY AND VERIFIED WITH: PHARMD A. PAYTES [id number] '@0806'  FH    Culture (A)  Final    STAPHYLOCOCCUS LUGDUNENSIS SUSCEPTIBILITIES TO FOLLOW Performed at [facility], [place] [address], [place], [place] [address]    Report Status PENDING  Incomplete  Blood Culture ID Panel (Reflexed)     Status: Abnormal   Collection Time: 12/15/21  6:06  AM  Result Value Ref Range Status   Enterococcus faecalis NOT DETECTED NOT DETECTED Final   Enterococcus Faecium NOT DETECTED NOT DETECTED Final   Listeria monocytogenes NOT DETECTED NOT DETECTED Final   Staphylococcus species DETECTED (A) NOT DETECTED Final    Comment: CRITICAL RESULT CALLED TO, READ BACK BY AND VERIFIED WITH: PHARMD A. PAYTES [id number] '@0807'  FH    Staphylococcus aureus (BCID) NOT DETECTED NOT DETECTED Final   Staphylococcus epidermidis NOT DETECTED NOT DETECTED Final   Staphylococcus lugdunensis DETECTED (A) NOT DETECTED Final    Comment: CRITICAL RESULT CALLED TO, READ BACK BY AND VERIFIED WITH: PHARMD A. PAYTES [id number] '@0807'  FH    Streptococcus species NOT DETECTED NOT DETECTED Final   Streptococcus agalactiae NOT DETECTED NOT DETECTED Final   Streptococcus pneumoniae NOT DETECTED NOT DETECTED Final   Streptococcus pyogenes NOT DETECTED NOT DETECTED Final   A.calcoaceticus-baumannii NOT DETECTED NOT DETECTED Final   Bacteroides fragilis NOT DETECTED NOT DETECTED Final   Enterobacterales NOT DETECTED NOT DETECTED Final   Enterobacter cloacae complex NOT DETECTED NOT DETECTED Final   Escherichia coli NOT DETECTED NOT DETECTED Final   Klebsiella aerogenes NOT DETECTED NOT DETECTED Final   Klebsiella oxytoca NOT DETECTED NOT DETECTED Final   Klebsiella pneumoniae NOT DETECTED NOT DETECTED Final   Proteus species NOT DETECTED NOT DETECTED Final   Salmonella species NOT DETECTED NOT DETECTED Final   Serratia marcescens NOT DETECTED NOT DETECTED Final   Haemophilus influenzae NOT DETECTED NOT DETECTED Final   Neisseria meningitidis NOT DETECTED NOT DETECTED Final   Pseudomonas aeruginosa NOT DETECTED NOT DETECTED Final   Stenotrophomonas maltophilia NOT DETECTED NOT DETECTED Final   Candida albicans NOT DETECTED NOT DETECTED Final   Candida auris NOT DETECTED NOT DETECTED Final   Candida glabrata NOT DETECTED NOT DETECTED Final   Candida krusei NOT DETECTED NOT DETECTED  Final   Candida parapsilosis NOT DETECTED NOT DETECTED Final   Candida tropicalis NOT DETECTED NOT DETECTED Final   Cryptococcus neoformans/gattii NOT DETECTED NOT DETECTED Final   Methicillin resistance mecA/C NOT DETECTED NOT DETECTED Final    Comment: Performed at [facility], [address] [place], [place] [address]  Blood culture (routine x 2)     Status: None (Preliminary result)   Collection Time: 12/15/21  6:07 AM   Specimen: BLOOD RIGHT FOREARM  Result Value Ref Range Status   Specimen Description BLOOD RIGHT FOREARM  Final   Special Requests   Final    BOTTLES DRAWN AEROBIC AND ANAEROBIC Blood Culture results may not be optimal due to an inadequate volume  of blood received in culture bottles   Culture   Final    NO GROWTH 2 DAYS Performed at [facility], [place] [address] [place], [place] [address]    Report Status PENDING  Incomplete  Culture, blood (Routine X 2) w Reflex to ID Panel     Status: None (Preliminary result)   Collection Time: 12/17/21  2:35 PM   Specimen: BLOOD  Result Value Ref Range Status   Specimen Description BLOOD RIGHT ANTECUBITAL  Final   Special Requests   Final    BOTTLES DRAWN AEROBIC ONLY Blood Culture results may not be optimal due to an inadequate volume of blood received in culture bottles Performed at [facility] [address], [place], [place] [address]    Culture PENDING  Incomplete   Report Status PENDING  Incomplete  Culture, blood (Routine X 2) w Reflex to ID Panel     Status: None (Preliminary result)   Collection Time: 12/17/21  2:35 PM   Specimen: BLOOD  Result Value Ref Range Status   Specimen Description BLOOD RIGHT ANTECUBITAL  Final   Special Requests   Final    BOTTLES DRAWN AEROBIC ONLY Blood Culture results may not be optimal due to an inadequate volume of blood received in culture bottles Performed at [facility] [address] [place], [place] [address]    Culture PENDING  Incomplete    Report Status PENDING  Incomplete     Labs: BNP (last 3 results) No results for input(s): "BNP" in the last 8760 hours. Basic Metabolic Panel: Recent Labs  Lab 12/14/21 1655 12/16/21 0545 12/18/21 0250  NA 132* 134* 138  K 4.2 4.0 3.8  CL 98 100 100  CO2 '26 25 28  ' GLUCOSE 449* 228* 159*  BUN '14 11 9  ' CREATININE 0.76 0.64 0.68  CALCIUM 9.1 8.5* 9.0  MG  --   --  1.7   Liver Function Tests: Recent Labs  Lab 12/14/21 1655  AST 14*  ALT 13  ALKPHOS 159*  BILITOT 0.4  PROT 7.1  ALBUMIN 3.3*   No results for input(s): "LIPASE", "AMYLASE" in the last 168 hours. No results for input(s): "AMMONIA" in the last 168 hours. CBC: Recent Labs  Lab 12/14/21 1655 12/16/21 0545 12/17/21 1439 12/18/21 0250  WBC 16.0* 13.5* 12.3* 11.6*  NEUTROABS 12.7*  --  8.9* 7.2  HGB 15.7 14.7 15.0 14.8  HCT 44.2 42.2 42.0 40.8  MCV 88.0 88.1 87.7 85.7  PLT 221 211 201 221   Cardiac Enzymes: No results for input(s): "CKTOTAL", "CKMB", "CKMBINDEX", "TROPONINI" in the last 168 hours. BNP: Invalid input(s): "POCBNP" CBG: Recent Labs  Lab 12/17/21 0803 12/17/21 1113 12/17/21 1612 12/17/21 2053 12/18/21 0730  GLUCAP 260* 208* 141* 147* 218*   D-Dimer No results for input(s): "DDIMER" in the last 72 hours. Hgb A1c Recent Labs    12/15/21 0849  HGBA1C 9.9*   Lipid Profile No results for input(s): "CHOL", "HDL", "LDLCALC", "TRIG", "CHOLHDL", "LDLDIRECT" in the last 72 hours. Thyroid function studies No results for input(s): "TSH", "T4TOTAL", "T3FREE", "THYROIDAB" in the last 72 hours.  Invalid input(s): "FREET3" Anemia work up No results for input(s): "VITAMINB12", "FOLATE", "FERRITIN", "TIBC", "IRON", "RETICCTPCT" in the last 72 hours. Urinalysis    Component Value Date/Time   COLORURINE YELLOW 09/07/2014 [place] 09/07/2014 0721   LABSPEC 1.018 09/07/2014 0721   PHURINE 6.5 09/07/2014 0721   GLUCOSEU NEGATIVE 09/07/2014 0721   HGBUR  NEGATIVE 09/07/2014  0721   BILIRUBINUR NEGATIVE 09/07/2014 0721   KETONESUR NEGATIVE 09/07/2014 0721   PROTEINUR NEGATIVE 09/07/2014 0721   UROBILINOGEN 0.2 09/07/2014 0721   NITRITE NEGATIVE 09/07/2014 0721   LEUKOCYTESUR NEGATIVE 09/07/2014 0721   Sepsis Labs Recent Labs  Lab 12/14/21 1655 12/16/21 0545 12/17/21 1439 12/18/21 0250  WBC 16.0* 13.5* 12.3* 11.6*   Microbiology Recent Results (from the past 240 hour(s))  Blood culture (routine x 2)     Status: Abnormal (Preliminary result)   Collection Time: 12/15/21  6:06 AM   Specimen: BLOOD LEFT FOREARM  Result Value Ref Range Status   Specimen Description BLOOD LEFT FOREARM  Final   Special Requests   Final    BOTTLES DRAWN AEROBIC AND ANAEROBIC Blood Culture adequate volume   Culture  Setup Time   Final    GRAM POSITIVE COCCI IN CLUSTERS IN BOTH AEROBIC AND ANAEROBIC BOTTLES CRITICAL RESULT CALLED TO, READ BACK BY AND VERIFIED WITH: PHARMD A. PAYTES [id number] '@0806'  FH    Culture (A)  Final    STAPHYLOCOCCUS LUGDUNENSIS SUSCEPTIBILITIES TO FOLLOW Performed at [facility], [place] [address] [place], [place] [address]    Report Status PENDING  Incomplete  Blood Culture ID Panel (Reflexed)     Status: Abnormal   Collection Time: 12/15/21  6:06 AM  Result Value Ref Range Status   Enterococcus faecalis NOT DETECTED NOT DETECTED Final   Enterococcus Faecium NOT DETECTED NOT DETECTED Final   Listeria monocytogenes NOT DETECTED NOT DETECTED Final   Staphylococcus species DETECTED (A) NOT DETECTED Final    Comment: CRITICAL RESULT CALLED TO, READ BACK BY AND VERIFIED WITH: PHARMD A. PAYTES [id number] '@0807'  FH    Staphylococcus aureus (BCID) NOT DETECTED NOT DETECTED Final   Staphylococcus epidermidis NOT DETECTED NOT DETECTED Final   Staphylococcus lugdunensis DETECTED (A) NOT DETECTED Final    Comment: CRITICAL RESULT CALLED TO, READ BACK BY AND VERIFIED WITH: PHARMD A. PAYTES [id number] '@0807'  FH    Streptococcus species NOT DETECTED NOT  DETECTED Final   Streptococcus agalactiae NOT DETECTED NOT DETECTED Final   Streptococcus pneumoniae NOT DETECTED NOT DETECTED Final   Streptococcus pyogenes NOT DETECTED NOT DETECTED Final   A.calcoaceticus-baumannii NOT DETECTED NOT DETECTED Final   Bacteroides fragilis NOT DETECTED NOT DETECTED Final   Enterobacterales NOT DETECTED NOT DETECTED Final   Enterobacter cloacae complex NOT DETECTED NOT DETECTED Final   Escherichia coli NOT DETECTED NOT DETECTED Final   Klebsiella aerogenes NOT DETECTED NOT DETECTED Final   Klebsiella oxytoca NOT DETECTED NOT DETECTED Final   Klebsiella pneumoniae NOT DETECTED NOT DETECTED Final   Proteus species NOT DETECTED NOT DETECTED Final   Salmonella species NOT DETECTED NOT DETECTED Final   Serratia marcescens NOT DETECTED NOT DETECTED Final   Haemophilus influenzae NOT DETECTED NOT DETECTED Final   Neisseria meningitidis NOT DETECTED NOT DETECTED Final   Pseudomonas aeruginosa NOT DETECTED NOT DETECTED Final   Stenotrophomonas maltophilia NOT DETECTED NOT DETECTED Final   Candida albicans NOT DETECTED NOT DETECTED Final   Candida auris NOT DETECTED NOT DETECTED Final   Candida glabrata NOT DETECTED NOT DETECTED Final   Candida krusei NOT DETECTED NOT DETECTED Final   Candida parapsilosis NOT DETECTED NOT DETECTED Final   Candida tropicalis NOT DETECTED NOT DETECTED Final   Cryptococcus neoformans/gattii NOT DETECTED NOT DETECTED Final   Methicillin resistance mecA/C NOT DETECTED NOT DETECTED Final    Comment: Performed at [facility], [address] [place],  [place] [address]  Blood culture (routine x 2)     Status: None (Preliminary result)   Collection Time: 12/15/21  6:07 AM   Specimen: BLOOD RIGHT FOREARM  Result Value Ref Range Status   Specimen Description BLOOD RIGHT FOREARM  Final   Special Requests   Final    BOTTLES DRAWN AEROBIC AND ANAEROBIC Blood Culture results may not be optimal due to an inadequate volume of blood  received in culture bottles   Culture   Final    NO GROWTH 2 DAYS Performed at [facility], [place] [address], [place], [place] [address]    Report Status PENDING  Incomplete  Culture, blood (Routine X 2) w Reflex to ID Panel     Status: None (Preliminary result)   Collection Time: 12/17/21  2:35 PM   Specimen: BLOOD  Result Value Ref Range Status   Specimen Description BLOOD RIGHT ANTECUBITAL  Final   Special Requests   Final    BOTTLES DRAWN AEROBIC ONLY Blood Culture results may not be optimal due to an inadequate volume of blood received in culture bottles Performed at [facility] [address] [place], [place] [address]    Culture PENDING  Incomplete   Report Status PENDING  Incomplete  Culture, blood (Routine X 2) w Reflex to ID Panel     Status: None (Preliminary result)   Collection Time: 12/17/21  2:35 PM   Specimen: BLOOD  Result Value Ref Range Status   Specimen Description BLOOD RIGHT ANTECUBITAL  Final   Special Requests   Final    BOTTLES DRAWN AEROBIC ONLY Blood Culture results may not be optimal due to an inadequate volume of blood received in culture bottles Performed at [facility] [address], [place], [place] [address]    Culture PENDING  Incomplete   Report Status PENDING  Incomplete     Time coordinating discharge: 35 minutes  SIGNED:   Barb Merino, MD  [facility] 12/18/2021, 8:07 AM

## 2021-12-20 LAB — CULTURE, BLOOD (ROUTINE X 2): Culture: NO GROWTH

## 2021-12-22 LAB — CULTURE, BLOOD (ROUTINE X 2)
Culture: NO GROWTH
Culture: NO GROWTH

## 2021-12-30 ENCOUNTER — Inpatient Hospital Stay (INDEPENDENT_AMBULATORY_CARE_PROVIDER_SITE_OTHER): Payer: Self-pay | Admitting: Primary Care

## 2021-12-30 ENCOUNTER — Encounter (INDEPENDENT_AMBULATORY_CARE_PROVIDER_SITE_OTHER): Payer: Self-pay

## 2022-08-28 ENCOUNTER — Other Ambulatory Visit: Payer: Self-pay

## 2022-08-28 ENCOUNTER — Encounter (HOSPITAL_COMMUNITY): Payer: Self-pay

## 2022-08-28 ENCOUNTER — Emergency Department (HOSPITAL_COMMUNITY)
Admission: EM | Admit: 2022-08-28 | Discharge: 2022-08-28 | Disposition: A | Discharge status: Home / Self Care | Payer: Self-pay | Attending: Emergency Medicine | Admitting: Emergency Medicine

## 2022-08-28 DIAGNOSIS — L02212 Cutaneous abscess of back [any part, except buttock]: Secondary | ICD-10-CM | POA: Insufficient documentation

## 2022-08-28 DIAGNOSIS — E119 Type 2 diabetes mellitus without complications: Secondary | ICD-10-CM | POA: Insufficient documentation

## 2022-08-28 MED ORDER — CLINDAMYCIN HCL 150 MG PO CAPS: 300.0000 mg | ORAL_CAPSULE | Freq: Three times a day (TID) | ORAL | 0 refills | Status: AC

## 2022-08-28 MED ORDER — OXYCODONE-ACETAMINOPHEN 5-325 MG PO TABS
2.0000 | ORAL_TABLET | Freq: Once | ORAL | Status: AC
  Administered 2022-08-28: 2 via ORAL
  Filled 2022-08-28: qty 2

## 2022-08-28 NOTE — ED Triage Notes (Addendum)
BIBA from home w/ abscess to upper back x4 days with heat, redness, swelling Tylenol @ midnight  Denies drainage.  Hx DM

## 2022-08-28 NOTE — Discharge Instructions (Addendum)
Thank you for letting us take care of you today.  You have a fairly large abscess to your back. I am starting you on antibiotics for this. Please take these as prescribed. Please use warm compresses to area several times a day for 15-20 minutes at a time to help soften area and allow it to drain.  You may take over the counter medications as needed for pain. With your history of diabetes, it is very important you establish care with a primary doctor. I provided 2 clinics that you may contact to set up an appointment. Please contact one of these or one of your own choosing to set up care with a PCP.  If you are not significantly improving and abscess is smaller and draining within 2 days, please return to nearest emergency department or an urgent care for recheck of your abscess.  If you develop new or worsening symptoms such as vomiting, high fevers, chest pain, shortness of breath, or other new, concerning symptoms, please return to nearest emergency department immediately for re-evaluation.

## 2022-08-28 NOTE — ED Provider Notes (Signed)
[place] EMERGENCY DEPARTMENT AT [facility] Provider Note   CSN: [id number] Arrival date & time: 08/28/22  1478     History  Chief Complaint  Patient presents with   Abscess    Bryan Mueller is a 52 y.o. male with PMH T2DM currently unmedicated who presents to ED with wife for abscess to back that started 5 days ago. Pt not on any recent antibiotics. C/o associated chills but no measured fever at home, nausea, vomiting, diarrhea, body aches, chest pain, shortness of breath, or other symptoms. No history of recurrent abscesses in this location.       Home Medications Not currently taking any medications  Allergies    Patient has no known allergies.    Review of Systems   Review of Systems  All other systems reviewed and are negative.   Physical Exam Updated Vital Signs BP 133/77 (BP Location: Left Arm)   Pulse 100   Temp 98.7 F (37.1 C) (Oral)   Resp 19   Ht  (1.676 m)   Wt 68.9 kg   SpO2 100%   BMI 24.53 kg/m  Physical Exam Vitals and nursing note reviewed.  Constitutional:      General: Bryan Mueller is not in acute distress.    Appearance: Normal appearance. Bryan Mueller is not toxic-appearing.  HENT:     Head: Normocephalic and atraumatic.     Mouth/Throat:     Mouth: Mucous membranes are moist.  Eyes:     Extraocular Movements: Extraocular movements intact.     Conjunctiva/sclera: Conjunctivae normal.  Cardiovascular:     Rate and Rhythm: Normal rate and regular rhythm.     Heart sounds: No murmur heard. Pulmonary:     Effort: Pulmonary effort is normal.     Breath sounds: Normal breath sounds.  Abdominal:     General: Abdomen is flat.     Palpations: Abdomen is soft.  Musculoskeletal:        General: Normal range of motion.     Cervical back: Normal range of motion and neck supple. No rigidity.     Right lower leg: No edema.     Left lower leg: No edema.  Skin:    General: Skin is warm and dry.     Capillary Refill: Capillary refill takes  less than 2 seconds.     Comments: Irregularly circular lesion to right upper back ~4cm in diameter with area of fluctuance and minimal drainage to center, ~2cm surrounding erythema and increased warmth with exquisite tenderness to palpation, no streaking erythema  Neurological:     Mental Status: Bryan Mueller is alert. Mental status is at baseline.  Psychiatric:        Behavior: Behavior normal.     ED Results / Procedures / Treatments   Labs (all labs ordered are listed, but only abnormal results are displayed) Labs Reviewed - No data to display  EKG None  Radiology No results found.  Procedures Procedures    Medications Ordered in ED Medications  oxyCODONE-acetaminophen (PERCOCET/ROXICET) 5-325 MG per tablet 2 tablet (has no administration in time range)    ED Course/ Medical Decision Making/ A&P                             Medical Decision Making Risk Prescription drug management.   Medical Decision Making:   Bryan Mueller is a 52 y.o. male who presented to the ED today with abscess detailed above.  Additional history discussed with patient's family/caregivers.  Patient's presentation is complicated by their history of T2DM untreated.  Complete initial physical exam performed, notably the patient was in no acute distress. Abscess to back with circular area of fluctuance and minimal drainage as above.    Reviewed and confirmed nursing documentation for past medical history, family history, social history.    Initial Assessment:   With the patient's presentation of abscess, differential diagnosis includes but is not limited to abscess, cyst, cellulitis, sepsis.  This is most consistent with an acute complicated illness  Initial Plan:  Pain management Offered I&D - pt declined in favor of starting with antibiotic treatment Objective evaluation as below reviewed   Final Assessment and Plan:   52 year old male presents to ED for evaluation of abscess to back. Somewhat  large irregularly circular lesion with central fluctuance and surrounding cellulitis as above. Pt afebrile, vital signs stable. Nontoxic appearing. Minimal drainage from center of abscess. Do believe it would benefit from I&D but pt declines in favor of starting antibiotics. History of diabetes, currently untreated. Will start on antibiotics and strongly encouraged establishing primary care with information provided. Pt instructed to closely follow up for re-evaluation in 2 days if not having significant improvement or sooner if symptoms worsen. Pt/wife expressed understanding of this. Strict ED return precautions given, all questions answered, and stable for discharge.     Clinical Impression:  1. Back abscess      Discharge           Final Clinical Impression(s) / ED Diagnoses Final diagnoses:  Back abscess    Rx / DC Orders ED Discharge Orders          Ordered    clindamycin (CLEOCIN) 150 MG capsule  3 times daily        08/28/22 0734              Tonette Lederer, PA-C 08/28/22 0743    Benjiman Core, MD 08/29/22 [phone]

## 2023-07-16 ENCOUNTER — Observation Stay (HOSPITAL_COMMUNITY): Payer: Self-pay

## 2023-07-16 ENCOUNTER — Other Ambulatory Visit: Payer: Self-pay

## 2023-07-16 ENCOUNTER — Observation Stay (HOSPITAL_COMMUNITY)
Admission: EM | Admit: 2023-07-16 | Discharge: 2023-07-17 | Disposition: A | Discharge status: Still In Hospital | Payer: Self-pay | Attending: Internal Medicine | Admitting: Internal Medicine

## 2023-07-16 ENCOUNTER — Encounter (HOSPITAL_COMMUNITY): Payer: Self-pay | Admitting: Emergency Medicine

## 2023-07-16 DIAGNOSIS — Z79899 Other long term (current) drug therapy: Secondary | ICD-10-CM | POA: Insufficient documentation

## 2023-07-16 DIAGNOSIS — R112 Nausea with vomiting, unspecified: Principal | ICD-10-CM | POA: Insufficient documentation

## 2023-07-16 DIAGNOSIS — Z7984 Long term (current) use of oral hypoglycemic drugs: Secondary | ICD-10-CM | POA: Insufficient documentation

## 2023-07-16 DIAGNOSIS — E871 Hypo-osmolality and hyponatremia: Principal | ICD-10-CM | POA: Insufficient documentation

## 2023-07-16 DIAGNOSIS — E1165 Type 2 diabetes mellitus with hyperglycemia: Secondary | ICD-10-CM | POA: Diagnosis present

## 2023-07-16 DIAGNOSIS — E876 Hypokalemia: Secondary | ICD-10-CM | POA: Diagnosis present

## 2023-07-16 LAB — CBC WITH DIFFERENTIAL/PLATELET
Abs Immature Granulocytes: 0.04 10*3/uL (ref 0.00–0.07)
Basophils Absolute: 0 10*3/uL (ref 0.0–0.1)
Basophils Relative: 0 %
Eosinophils Absolute: 0 10*3/uL (ref 0.0–0.5)
Eosinophils Relative: 0 %
HCT: 41.1 % (ref 39.0–52.0)
Hemoglobin: 14.8 g/dL (ref 13.0–17.0)
Immature Granulocytes: 0 %
Lymphocytes Relative: 17 %
Lymphs Abs: 1.5 10*3/uL (ref 0.7–4.0)
MCH: 31.1 pg (ref 26.0–34.0)
MCHC: 36 g/dL (ref 30.0–36.0)
MCV: 86.3 fL (ref 80.0–100.0)
Monocytes Absolute: 1.6 10*3/uL — ABNORMAL HIGH (ref 0.1–1.0)
Monocytes Relative: 18 %
Neutro Abs: 6 10*3/uL (ref 1.7–7.7)
Neutrophils Relative %: 65 %
Platelets: 196 10*3/uL (ref 150–400)
RBC: 4.76 MIL/uL (ref 4.22–5.81)
RDW: 11.8 % (ref 11.5–15.5)
WBC: 9.2 10*3/uL (ref 4.0–10.5)
nRBC: 0 % (ref 0.0–0.2)

## 2023-07-16 LAB — COMPREHENSIVE METABOLIC PANEL
ALT: 12 U/L (ref 0–44)
AST: 19 U/L (ref 15–41)
Albumin: 3.1 g/dL — ABNORMAL LOW (ref 3.5–5.0)
Alkaline Phosphatase: 156 U/L — ABNORMAL HIGH (ref 38–126)
Anion gap: 11 (ref 5–15)
BUN: 19 mg/dL (ref 6–20)
CO2: 22 mmol/L (ref 22–32)
Calcium: 7.5 mg/dL — ABNORMAL LOW (ref 8.9–10.3)
Chloride: 90 mmol/L — ABNORMAL LOW (ref 98–111)
Creatinine, Ser: 0.58 mg/dL — ABNORMAL LOW (ref 0.61–1.24)
GFR, Estimated: >60 mL/min (ref >60)
Glucose, Bld: 221 mg/dL — ABNORMAL HIGH (ref 70–99)
Potassium: 3.2 mmol/L — ABNORMAL LOW (ref 3.5–5.1)
Sodium: 123 mmol/L — ABNORMAL LOW (ref 135–145)
Total Bilirubin: 0.6 mg/dL (ref 0.0–1.2)
Total Protein: 6.7 g/dL (ref 6.5–8.1)

## 2023-07-16 LAB — BETA-HYDROXYBUTYRIC ACID: Beta-Hydroxybutyric Acid: 0.16 mmol/L (ref 0.05–0.27)

## 2023-07-16 LAB — LIPASE, BLOOD: Lipase: 23 U/L (ref 11–51)

## 2023-07-16 MED ORDER — SODIUM CHLORIDE 0.9 % IV BOLUS
1000.0000 mL | Freq: Once | INTRAVENOUS | Status: AC
  Administered 2023-07-16: 1000 mL via INTRAVENOUS

## 2023-07-16 MED ORDER — DIPHENHYDRAMINE HCL 50 MG/ML IJ SOLN
25.0000 mg | Freq: Once | INTRAMUSCULAR | Status: AC
  Administered 2023-07-16: 25 mg via INTRAVENOUS
  Filled 2023-07-16: qty 1

## 2023-07-16 MED ORDER — IOHEXOL 300 MG/ML  SOLN
100.0000 mL | Freq: Once | INTRAMUSCULAR | Status: AC | PRN
  Administered 2023-07-16: 100 mL via INTRAVENOUS

## 2023-07-16 MED ORDER — ONDANSETRON HCL 4 MG/2ML IJ SOLN
4.0000 mg | Freq: Once | INTRAMUSCULAR | Status: AC
  Administered 2023-07-16: 4 mg via INTRAVENOUS
  Filled 2023-07-16: qty 2

## 2023-07-16 MED ORDER — METOCLOPRAMIDE HCL 5 MG/ML IJ SOLN
10.0000 mg | Freq: Once | INTRAMUSCULAR | Status: AC
Start: 2023-07-16 — End: 2023-07-16
  Administered 2023-07-16: 10 mg via INTRAVENOUS
  Filled 2023-07-16: qty 2

## 2023-07-16 MED ORDER — CALCIUM GLUCONATE-NACL 1-0.675 GM/50ML-% IV SOLN
1.0000 g | Freq: Once | INTRAVENOUS | Status: AC
  Administered 2023-07-16: 1000 mg via INTRAVENOUS
  Filled 2023-07-16: qty 50

## 2023-07-16 NOTE — ED Provider Notes (Signed)
 [place] EMERGENCY DEPARTMENT AT [facility] Provider Note   CSN: [id number] Arrival date & time: 07/16/23  2103     History  Chief Complaint  Patient presents with   Vomiting   Chills    Bryan Mueller is a 53 y.o. male.  53 yo M with a chief complaints of nausea and vomiting.  Going on for couple days.  No known sick contacts.  Having chills as well.  Denies abdominal pain.  Blood sugars been elevated with this.        Home Medications Prior to Admission medications   Medication Sig Start Date End Date Taking? Authorizing Provider  blood glucose meter kit and supplies Dispense based on patient and insurance preference. Use up to four times daily as directed. (FOR ICD-10 E10.9, E11.9). 12/17/21   Dorcas Carrow, MD  glipiZIDE (GLUCOTROL) 5 MG tablet Take 1 tablet (5 mg total) by mouth 2 (two) times daily before a meal. 12/17/21   Dorcas Carrow, MD  Insulin Pen Needle 32G X 4 MM MISC Use as directed 12/17/21   Dorcas Carrow, MD  metFORMIN (GLUCOPHAGE) 1000 MG tablet Take 1 tablet (1,000 mg total) by mouth 2 (two) times daily with a meal. 12/18/21 01/17/22  Dorcas Carrow, MD      Allergies    Patient has no known allergies.    Review of Systems   Review of Systems  Physical Exam Updated Vital Signs BP 129/88   Pulse 84   Temp 98.4 F (36.9 C) (Oral)   Resp 18   Ht 5\' 6"  (1.676 m)   Wt 72.6 kg   SpO2 100%   BMI 25.82 kg/m  Physical Exam Vitals and nursing note reviewed.  Constitutional:      Appearance: He is well-developed.  HENT:     Head: Normocephalic and atraumatic.  Eyes:     Pupils: Pupils are equal, round, and reactive to light.  Neck:     Vascular: No JVD.  Cardiovascular:     Rate and Rhythm: Normal rate and regular rhythm.     Heart sounds: No murmur heard.    No friction rub. No gallop.  Pulmonary:     Effort: No respiratory distress.     Breath sounds: No wheezing.  Abdominal:     General: There is no distension.      Tenderness: There is no abdominal tenderness. There is no guarding or rebound.  Musculoskeletal:        General: Normal range of motion.     Cervical back: Normal range of motion and neck supple.  Skin:    Coloration: Skin is not pale.     Findings: No rash.  Neurological:     Mental Status: He is alert and oriented to person, place, and time.  Psychiatric:        Behavior: Behavior normal.     ED Results / Procedures / Treatments   Labs (all labs ordered are listed, but only abnormal results are displayed) Labs Reviewed  CBC WITH DIFFERENTIAL/PLATELET - Abnormal; Notable for the following components:      Result Value   Monocytes Absolute 1.6 (*)    All other components within normal limits  COMPREHENSIVE METABOLIC PANEL - Abnormal; Notable for the following components:   Sodium 123 (*)    Potassium 3.2 (*)    Chloride 90 (*)    Glucose, Bld 221 (*)    Creatinine, Ser 0.58 (*)    Calcium 7.5 (*)  Albumin 3.1 (*)    Alkaline Phosphatase 156 (*)    All other components within normal limits  LIPASE, BLOOD  BETA-HYDROXYBUTYRIC ACID  SODIUM, URINE, RANDOM  URINALYSIS, ROUTINE W REFLEX MICROSCOPIC  OSMOLALITY  RAPID URINE DRUG SCREEN, HOSP PERFORMED    EKG None  Radiology No results found.  Procedures .Critical Care  Performed by: Melene Plan, DO Authorized by: Melene Plan, DO   Critical care provider statement:    Critical care time (minutes):  35   Critical care time was exclusive of:  Separately billable procedures and treating other patients   Critical care was time spent personally by me on the following activities:  Development of treatment plan with patient or surrogate, discussions with consultants, evaluation of patient's response to treatment, examination of patient, ordering and review of laboratory studies, ordering and review of radiographic studies, ordering and performing treatments and interventions, pulse oximetry, re-evaluation of patient's condition  and review of old charts   Care discussed with: admitting provider       Medications Ordered in ED Medications  calcium gluconate 1 g/ 50 mL sodium chloride IVPB (1,000 mg Intravenous New Bag/Given 07/16/23 2251)  sodium chloride 0.9 % bolus 1,000 mL (0 mLs Intravenous Stopped 07/16/23 2243)  ondansetron (ZOFRAN) injection 4 mg (4 mg Intravenous Given 07/16/23 2122)  metoCLOPramide (REGLAN) injection 10 mg (10 mg Intravenous Given 07/16/23 2248)  diphenhydrAMINE (BENADRYL) injection 25 mg (25 mg Intravenous Given 07/16/23 2247)    ED Course/ Medical Decision Making/ A&P                                 Medical Decision Making Amount and/or Complexity of Data Reviewed Labs: ordered. Radiology: ordered.  Risk Prescription drug management. Decision regarding hospitalization.   53 yo M with a chief complaints of nausea and vomiting.  Going on for about 12 to 24 hours.  Patient found to be hypoglycemic with EMS.  Will obtain a laboratory evaluation bolus of IV fluids antiemetics reassess.  Patient's lab work not consistent with diabetic ketoacidosis.  Bicarb is normal glucose 220.  He is hyponatremic at 123.  I wonder if he is symptomatic from his hyponatremia as the cause of his symptoms.  He is having frequent hiccuping now on repeat assessment.  Continues to be tremulous.  He denies any new medications with the exception of taking a lot of ibuprofen.  Will discuss with medicine.   I discussed case with hospitalist recommending CT imaging UDS.  The patients results and plan were reviewed and discussed.   Any x-rays performed were independently reviewed by myself.   Differential diagnosis were considered with the presenting HPI.  Medications  calcium gluconate 1 g/ 50 mL sodium chloride IVPB (1,000 mg Intravenous New Bag/Given 07/16/23 2251)  sodium chloride 0.9 % bolus 1,000 mL (0 mLs Intravenous Stopped 07/16/23 2243)  ondansetron (ZOFRAN) injection 4 mg (4 mg Intravenous Given 07/16/23  2122)  metoCLOPramide (REGLAN) injection 10 mg (10 mg Intravenous Given 07/16/23 2248)  diphenhydrAMINE (BENADRYL) injection 25 mg (25 mg Intravenous Given 07/16/23 2247)    Vitals:   07/16/23 2113 07/16/23 2130 07/16/23 2200 07/16/23 2230  BP:  139/89 (!) 151/87 129/88  Pulse:  89  84  Resp:    18  Temp:      TempSrc:      SpO2:  100%  100%  Weight: 72.6 kg     Height: 5\' 6"  (1.676  m)       Final diagnoses:  Hyponatremia  Nausea and vomiting in adult    Admission/ observation were discussed with the admitting physician, patient and/or family and they are comfortable with the plan.         Final Clinical Impression(s) / ED Diagnoses Final diagnoses:  Hyponatremia  Nausea and vomiting in adult    Rx / DC Orders ED Discharge Orders     None         Melene Plan, DO 07/16/23 2302

## 2023-07-16 NOTE — H&P (Signed)
 History and Physical    Patient: Bryan Mueller ZOX:[id number] DOB: [birth year] DOA: 07/16/2023 DOS: the patient was seen and examined on 07/16/2023 PCP: Patient, No Pcp Per  Patient coming from: Home  Chief Complaint:  Chief Complaint  Patient presents with   Vomiting   Chills   HPI: Bryan Mueller is a 53 y.o. male with medical history significant of non-insulin-dependent diabetes, who presented to the ER with 3 days of intractable nausea vomiting and diarrhea as well as body shaking and chills.  He has had 3 episode of vomiting today and 5 days of diarrhea so far.  He did not report any fevers.  Patient has been off medication for his diabetes.  Denied any sick contacts.  Denied any hematemesis, no melena no bright red blood per rectum.  Patient was seen and evaluated in the ER.  He was found to have a sodium of 123, hypokalemia and hypocalcemia.  Despite initial treatment in the ER his vomiting has persisted.  Patient is therefore being admitted to the medical service for further evaluation and treatment  Review of Systems: As mentioned in the history of present illness. All other systems reviewed and are negative. Past Medical History:  Diagnosis Date   Cellulitis    Diabetes mellitus, type 2 (HCC)    History reviewed. No pertinent surgical history. Social History:  reports that he has never smoked. His smokeless tobacco use includes chew. He reports that he does not currently use alcohol. He reports that he does not use drugs.  No Known Allergies  History reviewed. No pertinent family history.  Prior to Admission medications   Medication Sig Start Date End Date Taking? Authorizing Provider  blood glucose meter kit and supplies Dispense based on patient and insurance preference. Use up to four times daily as directed. (FOR ICD-10 E10.9, E11.9). 12/17/21   Dorcas Carrow, MD  glipiZIDE (GLUCOTROL) 5 MG tablet Take 1 tablet (5 mg total) by mouth 2 (two) times daily before a meal. 12/17/21    Dorcas Carrow, MD  Insulin Pen Needle 32G X 4 MM MISC Use as directed 12/17/21   Dorcas Carrow, MD  metFORMIN (GLUCOPHAGE) 1000 MG tablet Take 1 tablet (1,000 mg total) by mouth 2 (two) times daily with a meal. 12/18/21 01/17/22  Dorcas Carrow, MD    Physical Exam: Vitals:   07/16/23 2110 07/16/23 2113  BP: (!) 150/96   Pulse: 88   Resp: 16   Temp: 98.4 F (36.9 C)   TempSrc: Oral   SpO2: 100%   Weight:  72.6 kg  Height:  5\' 6"  (1.676 m)   Constitutional: Acutely ill looking, NAD, calm, comfortable Eyes: PERRL, lids and conjunctivae normal ENMT: Mucous membranes are dry posterior pharynx clear of any exudate or lesions.Normal dentition.  Neck: normal, supple, no masses, no thyromegaly Respiratory: clear to auscultation bilaterally, no wheezing, no crackles. Normal respiratory effort. No accessory muscle use.  Cardiovascular: Regular rate and rhythm, no murmurs / rubs / gallops. No extremity edema. 2+ pedal pulses. No carotid bruits.  Abdomen: no tenderness, no masses palpated. No hepatosplenomegaly. Bowel sounds positive.  Musculoskeletal: Good range of motion, no joint swelling or tenderness, Skin: no rashes, lesions, ulcers. No induration Neurologic: CN 2-12 grossly intact. Sensation intact, DTR normal. Strength 5/5 in all 4.  Psychiatric: Normal judgment and insight. Alert and oriented x 3. Normal mood  Data Reviewed:  Temperature 98, blood pressure 150/96, sodium 123 potassium 3.2 chloride 90 CO2 25 calcium 7.5 and glucose 221 beta-hydroxybutyrate acid  0.16.  Assessment and Plan:  #1 intractable nausea with vomiting and diarrhea: Most likely secondary to acute viral illness, could also be diabetic gastroparesis.  Patient will need urine drug screen also to rule out drugs or psychosis.  CT abdomen pelvis may be warranted as patient was elevated alkaline phosphatase.  Will continue with IV fluids, supportive care.  Nausea vomiting management with Zofran.  #2 uncontrolled  diabetes: Continue sliding scale insulin.  Continue close monitoring  #3 hypocalcemia: Replete calcium.  #4 hypokalemia: Most likely secondary to intractable nausea vomiting.  Will continue with potassium repletion.  #5 hyponatremia: Sodium 123.  Continue with saline.  Most likely dehydration from the nausea vomiting and diarrhea.  Follow sodium level after saline infusion.    Advance Care Planning:   Code Status: Prior full code  Consults: None  Family Communication: No family at bedside  Severity of Illness: The appropriate patient status for this patient is OBSERVATION. Observation status is judged to be reasonable and necessary in order to provide the required intensity of service to ensure the patient's safety. The patient's presenting symptoms, physical exam findings, and initial radiographic and laboratory data in the context of their medical condition is felt to place them at decreased risk for further clinical deterioration. Furthermore, it is anticipated that the patient will be medically stable for discharge from the hospital within 2 midnights of admission.   AuthorLonia Blood, MD 07/16/2023 10:32 PM  For on call review [email or web address].

## 2023-07-16 NOTE — ED Triage Notes (Signed)
 Pt to ED via [facility] from home c/o n/v/d x3 days and body shaking/chills.  States emesis x3, diarrhea x5.  Denies pain, fevers, or urinary changes.  Hx of DBM, no medications, has had increased thirst.  EMS cbg 294.

## 2023-07-17 LAB — RAPID URINE DRUG SCREEN, HOSP PERFORMED
Amphetamines: NOT DETECTED
Barbiturates: NOT DETECTED
Benzodiazepines: NOT DETECTED
Cocaine: NOT DETECTED
Opiates: NOT DETECTED
Tetrahydrocannabinol: NOT DETECTED

## 2023-07-17 LAB — CBC
HCT: 40.1 % (ref 39.0–52.0)
Hemoglobin: 14.5 g/dL (ref 13.0–17.0)
MCH: 31.5 pg (ref 26.0–34.0)
MCHC: 36.2 g/dL — ABNORMAL HIGH (ref 30.0–36.0)
MCV: 87 fL (ref 80.0–100.0)
Platelets: 187 10*3/uL (ref 150–400)
RBC: 4.61 MIL/uL (ref 4.22–5.81)
RDW: 12.1 % (ref 11.5–15.5)
WBC: 8.3 10*3/uL (ref 4.0–10.5)
nRBC: 0 % (ref 0.0–0.2)

## 2023-07-17 LAB — URINALYSIS, ROUTINE W REFLEX MICROSCOPIC
Bilirubin Urine: NEGATIVE
Glucose, UA: NEGATIVE mg/dL
Hgb urine dipstick: NEGATIVE
Ketones, ur: NEGATIVE mg/dL
Leukocytes,Ua: NEGATIVE
Nitrite: NEGATIVE
Protein, ur: NEGATIVE mg/dL
Specific Gravity, Urine: 1.009 (ref 1.005–1.030)
pH: 6 (ref 5.0–8.0)

## 2023-07-17 LAB — COMPREHENSIVE METABOLIC PANEL
ALT: 12 U/L (ref 0–44)
AST: 19 U/L (ref 15–41)
Albumin: 3.1 g/dL — ABNORMAL LOW (ref 3.5–5.0)
Alkaline Phosphatase: 146 U/L — ABNORMAL HIGH (ref 38–126)
Anion gap: 7 (ref 5–15)
BUN: 15 mg/dL (ref 6–20)
CO2: 21 mmol/L — ABNORMAL LOW (ref 22–32)
Calcium: 7.9 mg/dL — ABNORMAL LOW (ref 8.9–10.3)
Chloride: 102 mmol/L (ref 98–111)
Creatinine, Ser: 0.52 mg/dL — ABNORMAL LOW (ref 0.61–1.24)
GFR, Estimated: >60 mL/min (ref >60)
Glucose, Bld: 216 mg/dL — ABNORMAL HIGH (ref 70–99)
Potassium: 3.8 mmol/L (ref 3.5–5.1)
Sodium: 130 mmol/L — ABNORMAL LOW (ref 135–145)
Total Bilirubin: 0.7 mg/dL (ref 0.0–1.2)
Total Protein: 6.5 g/dL (ref 6.5–8.1)

## 2023-07-17 LAB — HEMOGLOBIN A1C
Hgb A1c MFr Bld: 9.5 % — ABNORMAL HIGH (ref 4.8–5.6)
Mean Plasma Glucose: 225.95 mg/dL

## 2023-07-17 LAB — OSMOLALITY: Osmolality: 265 mosm/kg — ABNORMAL LOW (ref 275–295)

## 2023-07-17 LAB — SODIUM, URINE, RANDOM: Sodium, Ur: <10 mmol/L

## 2023-07-17 LAB — CBG MONITORING, ED: Glucose-Capillary: 177 mg/dL — ABNORMAL HIGH (ref 70–99)

## 2023-07-17 LAB — GLUCOSE, CAPILLARY: Glucose-Capillary: 229 mg/dL — ABNORMAL HIGH (ref 70–99)

## 2023-07-17 LAB — HIV ANTIBODY (ROUTINE TESTING W REFLEX): HIV Screen 4th Generation wRfx: NONREACTIVE

## 2023-07-17 MED ORDER — ENOXAPARIN SODIUM 40 MG/0.4ML IJ SOSY
40.0000 mg | PREFILLED_SYRINGE | INTRAMUSCULAR | Status: DC
  Administered 2023-07-17: 40 mg via SUBCUTANEOUS
  Filled 2023-07-17: qty 0.4

## 2023-07-17 MED ORDER — KCL IN DEXTROSE-NACL 40-5-0.9 MEQ/L-%-% IV SOLN
INTRAVENOUS | Status: DC
  Filled 2023-07-17 (×2): qty 1000

## 2023-07-17 MED ORDER — ONDANSETRON HCL 4 MG PO TABS: 4.0000 mg | ORAL_TABLET | Freq: Four times a day (QID) | ORAL | 0 refills | Status: DC | PRN

## 2023-07-17 MED ORDER — INSULIN ASPART 100 UNIT/ML IJ SOLN
0.0000 [IU] | Freq: Three times a day (TID) | INTRAMUSCULAR | Status: DC
  Administered 2023-07-17: 5 [IU] via SUBCUTANEOUS
  Filled 2023-07-17: qty 0.15

## 2023-07-17 MED ORDER — GLIPIZIDE 5 MG PO TABS: 5.0000 mg | ORAL_TABLET | Freq: Two times a day (BID) | ORAL | 1 refills | Status: DC

## 2023-07-17 MED ORDER — MORPHINE SULFATE (PF) 2 MG/ML IV SOLN: 2.0000 mg | INTRAVENOUS | Status: DC | PRN

## 2023-07-17 MED ORDER — INSULIN ASPART 100 UNIT/ML IJ SOLN
0.0000 [IU] | Freq: Every day | INTRAMUSCULAR | Status: DC
  Filled 2023-07-17: qty 0.05

## 2023-07-17 MED ORDER — ONDANSETRON HCL 4 MG/2ML IJ SOLN: 4.0000 mg | Freq: Four times a day (QID) | INTRAMUSCULAR | Status: DC | PRN

## 2023-07-17 MED ORDER — METFORMIN HCL 1000 MG PO TABS: 1000.0000 mg | ORAL_TABLET | Freq: Two times a day (BID) | ORAL | 0 refills | Status: DC

## 2023-07-17 MED ORDER — ONDANSETRON HCL 4 MG PO TABS: 4.0000 mg | ORAL_TABLET | Freq: Four times a day (QID) | ORAL | Status: DC | PRN

## 2023-07-17 NOTE — Discharge Summary (Signed)
 Physician Discharge Summary   Patient: Bryan Mueller MRN: [id number] DOB: [birth year]  Admit date:     07/16/2023  Discharge date: 07/17/23  Discharge Physician: Deanna Artis   PCP: Patient, No Pcp Per   Recommendations at discharge:   At this time patient will be discharged home.  If you experience any symptoms such as fever, vomiting, shortness of breath, chest pain, abdominal pain, or other concerning symptoms, please call your primary care provider or go to the emergency department immediately.  Discharge Diagnoses: Principal Problem:   Intractable vomiting with nausea Active Problems:   Uncontrolled type 2 diabetes mellitus with hyperglycemia, without long-term current use of insulin (HCC)   Hyponatremia   Hypokalemia   Hypocalcemia  Resolved Problems:   * No resolved hospital problems. *  Hospital Course: 53 y.o. male with medical history significant of non-insulin-dependent diabetes, who presented to the ER with 3 days of intractable nausea vomiting and diarrhea as well as body shaking and chills.   Assessment and Plan:  Intractable nausea vomiting diarrhea - Etiology includes viral illness or diabetic gastroparesis.  Leaning towards the latter given A1c greater than 9.  Symptomatic therapy provided while inpatient.  Appears to be totally resolved at this time.  Patient able to tolerate p.o.    Uncontrolled diabetes mellitus - A1c 9.5.  Patient noncompliant with his diabetic regimen.  Encourage patient to restart his diabetic regimen and follow-up with PCP to titrate diabetic medications.  Informed patient about long-term consequences of uncontrolled diabetes including advanced kidney disease, blindness, neuropathy, and increased cardiovascular risk of stroke and heart attack.  Will send referral for PCP.  Feel patient's metformin and glipizide.  Recommend yearly foot/eye/urine protein exams.  Hyponatremia with hypokalemia - Secondary to GI loss.  Resolved after fluid  hydration and resuscitation of electrolytes.  Patient feeling improved.  Hypocalcemia - Resolved after replenishment.       Consultants: None Procedures performed: None Disposition: Home Diet recommendation:  Discharge Diet Orders (From admission, onward)     Start     Ordered   07/17/23 0000  Diet - low sodium heart healthy        07/17/23 1103   07/17/23 0000  Diet Carb Modified        07/17/23 1103           Carb modified diet DISCHARGE MEDICATION: Allergies as of 07/17/2023   No Known Allergies      Medication List     TAKE these medications    Accu-Chek Guide w/Device Kit Dispense based on patient and insurance preference. Use up to four times daily as directed. (FOR ICD-10 E10.9, E11.9).   glipiZIDE 5 MG tablet Commonly known as: GLUCOTROL Take 1 tablet (5 mg total) by mouth 2 (two) times daily before a meal.   metFORMIN 1000 MG tablet Commonly known as: GLUCOPHAGE Take 1 tablet (1,000 mg total) by mouth 2 (two) times daily with a meal.   ondansetron 4 MG tablet Commonly known as: ZOFRAN Take 1 tablet (4 mg total) by mouth every 6 (six) hours as needed for nausea.   Pentips 32G X 4 MM Misc Generic drug: Insulin Pen Needle Use as directed        Discharge Exam: Filed Weights   07/16/23 2113  Weight: 72.6 kg   GENERAL:  Alert, pleasant, no acute distress  HEENT:  EOMI CARDIOVASCULAR:  RRR, no murmurs appreciated RESPIRATORY:  Clear to auscultation, no wheezing, rales, or rhonchi GASTROINTESTINAL:  Soft, nontender, nondistended EXTREMITIES:  No LE edema bilaterally NEURO:  No new focal deficits appreciated SKIN:  No rashes noted PSYCH:  Appropriate mood and affect    Condition at discharge: improving  The results of significant diagnostics from this hospitalization (including imaging, microbiology, ancillary and laboratory) are listed below for reference.   Imaging Studies: CT ABDOMEN PELVIS W CONTRAST Result Date:  07/16/2023 CLINICAL DATA:  Acute nonlocalized abdominal pain EXAM: CT ABDOMEN AND PELVIS WITH CONTRAST TECHNIQUE: Multidetector CT imaging of the abdomen and pelvis was performed using the standard protocol following bolus administration of intravenous contrast. RADIATION DOSE REDUCTION: This exam was performed according to the departmental dose-optimization program which includes automated exposure control, adjustment of the mA and/or kV according to patient size and/or use of iterative reconstruction technique. CONTRAST:  OMNIPAQUE IOHEXOL 300 MG/ML  SOLN COMPARISON:  09/06/2024 FINDINGS: Lower chest: No acute abnormality. Hepatobiliary: Moderate hepatic steatosis. Tiny cyst within the inferior right hepatic lobe. No enhancing intrahepatic mass. No intra or extrahepatic biliary ductal dilation. Gallbladder unremarkable. Pancreas: Unremarkable Spleen: Unremarkable Adrenals/Urinary Tract: Adrenal glands are unremarkable. Kidneys are normal, without renal calculi, focal lesion, or hydronephrosis. Bladder is unremarkable. Stomach/Bowel: Stomach is within normal limits. Appendix appears normal. No evidence of bowel wall thickening, distention, or inflammatory changes. Vascular/Lymphatic: No significant vascular findings are present. No enlarged abdominal or pelvic lymph nodes. Reproductive: Moderate prostatic hypertrophy. Other: A moderate fat containing umbilical hernia is present with infiltration of the fat contained within the hernia sac possibly related to incarceration and resultant edema. The hernia sac measures 2.3 x 5.2 x 4.3 cm. The hernia defect measures 10 mm x 14 mm. Musculoskeletal: No acute or significant osseous findings. IMPRESSION: 1. Moderate fat containing umbilical hernia with infiltration of the fat contained within the hernia sac possibly related to incarceration and resultant edema. 2. Moderate hepatic steatosis. 3. Moderate prostatic hypertrophy. Electronically Signed   By: Helyn Numbers  M.D.   On: 07/16/2023 23:56    Microbiology: Results for orders placed or performed during the hospital encounter of 12/14/21  Blood culture (routine x 2)     Status: Abnormal   Collection Time: 12/15/21  6:06 AM   Specimen: BLOOD LEFT FOREARM  Result Value Ref Range Status   Specimen Description BLOOD LEFT FOREARM  Final   Special Requests   Final    BOTTLES DRAWN AEROBIC AND ANAEROBIC Blood Culture adequate volume   Culture  Setup Time   Final    GRAM POSITIVE COCCI IN CLUSTERS IN BOTH AEROBIC AND ANAEROBIC BOTTLES CRITICAL RESULT CALLED TO, READ BACK BY AND VERIFIED WITH: PHARMD A. PAYTES [id number] @0806  FH Performed at [facility], [address] [place], [place] [address]    Culture STAPHYLOCOCCUS LUGDUNENSIS (A)  Final   Report Status 12/18/2021 FINAL  Final   Organism ID, Bacteria STAPHYLOCOCCUS LUGDUNENSIS  Final      Susceptibility   Staphylococcus lugdunensis - MIC*    CIPROFLOXACIN <=0.5 SENSITIVE Sensitive     ERYTHROMYCIN <=0.25 SENSITIVE Sensitive     GENTAMICIN <=0.5 SENSITIVE Sensitive     OXACILLIN 2 SENSITIVE Sensitive     TETRACYCLINE <=1 SENSITIVE Sensitive     VANCOMYCIN <=0.5 SENSITIVE Sensitive     TRIMETH/SULFA <=10 SENSITIVE Sensitive     CLINDAMYCIN <=0.25 SENSITIVE Sensitive     RIFAMPIN <=0.5 SENSITIVE Sensitive     Inducible Clindamycin NEGATIVE Sensitive     * STAPHYLOCOCCUS LUGDUNENSIS  Blood Culture ID Panel (Reflexed)     Status: Abnormal   Collection  Time: 12/15/21  6:06 AM  Result Value Ref Range Status   Enterococcus faecalis NOT DETECTED NOT DETECTED Final   Enterococcus Faecium NOT DETECTED NOT DETECTED Final   Listeria monocytogenes NOT DETECTED NOT DETECTED Final   Staphylococcus species DETECTED (A) NOT DETECTED Final    Comment: CRITICAL RESULT CALLED TO, READ BACK BY AND VERIFIED WITH: PHARMD A. PAYTES [id number] @0807  FH    Staphylococcus aureus (BCID) NOT DETECTED NOT DETECTED Final   Staphylococcus epidermidis NOT DETECTED  NOT DETECTED Final   Staphylococcus lugdunensis DETECTED (A) NOT DETECTED Final    Comment: CRITICAL RESULT CALLED TO, READ BACK BY AND VERIFIED WITH: PHARMD A. PAYTES [id number] @0807  FH    Streptococcus species NOT DETECTED NOT DETECTED Final   Streptococcus agalactiae NOT DETECTED NOT DETECTED Final   Streptococcus pneumoniae NOT DETECTED NOT DETECTED Final   Streptococcus pyogenes NOT DETECTED NOT DETECTED Final   A.calcoaceticus-baumannii NOT DETECTED NOT DETECTED Final   Bacteroides fragilis NOT DETECTED NOT DETECTED Final   Enterobacterales NOT DETECTED NOT DETECTED Final   Enterobacter cloacae complex NOT DETECTED NOT DETECTED Final   Escherichia coli NOT DETECTED NOT DETECTED Final   Klebsiella aerogenes NOT DETECTED NOT DETECTED Final   Klebsiella oxytoca NOT DETECTED NOT DETECTED Final   Klebsiella pneumoniae NOT DETECTED NOT DETECTED Final   Proteus species NOT DETECTED NOT DETECTED Final   Salmonella species NOT DETECTED NOT DETECTED Final   Serratia marcescens NOT DETECTED NOT DETECTED Final   Haemophilus influenzae NOT DETECTED NOT DETECTED Final   Neisseria meningitidis NOT DETECTED NOT DETECTED Final   Pseudomonas aeruginosa NOT DETECTED NOT DETECTED Final   Stenotrophomonas maltophilia NOT DETECTED NOT DETECTED Final   Candida albicans NOT DETECTED NOT DETECTED Final   Candida auris NOT DETECTED NOT DETECTED Final   Candida glabrata NOT DETECTED NOT DETECTED Final   Candida krusei NOT DETECTED NOT DETECTED Final   Candida parapsilosis NOT DETECTED NOT DETECTED Final   Candida tropicalis NOT DETECTED NOT DETECTED Final   Cryptococcus neoformans/gattii NOT DETECTED NOT DETECTED Final   Methicillin resistance mecA/C NOT DETECTED NOT DETECTED Final    Comment: Performed at [facility], [address], [place], [place] [address]  Blood culture (routine x 2)     Status: None   Collection Time: 12/15/21  6:07 AM   Specimen: BLOOD RIGHT FOREARM  Result Value Ref Range  Status   Specimen Description BLOOD RIGHT FOREARM  Final   Special Requests   Final    BOTTLES DRAWN AEROBIC AND ANAEROBIC Blood Culture results may not be optimal due to an inadequate volume of blood received in culture bottles   Culture   Final    NO GROWTH 5 DAYS Performed at [facility], [address], [place], [place] [address]    Report Status 12/20/2021 FINAL  Final  Culture, blood (Routine X 2) w Reflex to ID Panel     Status: None   Collection Time: 12/17/21  2:35 PM   Specimen: BLOOD  Result Value Ref Range Status   Specimen Description BLOOD RIGHT ANTECUBITAL  Final   Special Requests   Final    BOTTLES DRAWN AEROBIC ONLY Blood Culture results may not be optimal due to an inadequate volume of blood received in culture bottles   Culture   Final    NO GROWTH 5 DAYS Performed at [facility], [address] [place], [place] [address]    Report Status 12/22/2021 FINAL  Final  Culture, blood (Routine X 2) w Reflex to ID Panel     Status: None   Collection Time: 12/17/21  2:35 PM   Specimen: BLOOD  Result Value Ref Range Status   Specimen Description BLOOD RIGHT ANTECUBITAL  Final   Special Requests   Final    BOTTLES DRAWN AEROBIC ONLY Blood Culture results may not be optimal due to an inadequate volume of blood received in culture bottles   Culture   Final    NO GROWTH 5 DAYS Performed at [facility], [address] [place], [place] [address]    Report Status 12/22/2021 FINAL  Final    Labs: CBC: Recent Labs  Lab 07/16/23 2119 07/17/23 0607  WBC 9.2 8.3  NEUTROABS 6.0  --   HGB 14.8 14.5  HCT 41.1 40.1  MCV 86.3 87.0  PLT 196 187   Basic Metabolic Panel: Recent Labs  Lab 07/16/23 2119 07/17/23 0607  NA 123* 130*  K 3.2* 3.8  CL 90* 102  CO2 22 21*  GLUCOSE 221* 216*  BUN 19 15  CREATININE 0.58* 0.52*  CALCIUM 7.5* 7.9*   Liver Function Tests: Recent Labs  Lab 07/16/23 2119 07/17/23 0607  AST 19 19  ALT 12 12  ALKPHOS  156* 146*  BILITOT 0.6 0.7  PROT 6.7 6.5  ALBUMIN 3.1* 3.1*   CBG: Recent Labs  Lab 07/17/23 0039 07/17/23 0753  GLUCAP 177* 229*    Discharge time spent: less than 30 minutes.  Signed: Deanna Artis, DO [facility] 07/17/2023

## 2023-07-17 NOTE — Plan of Care (Signed)

## 2023-07-17 NOTE — ED Notes (Signed)
 Pt up to bathroom, minimal assist needed to keep patient steady.  Wife with pt in bathroom for assistance.

## 2024-03-18 ENCOUNTER — Other Ambulatory Visit (HOSPITAL_COMMUNITY): Payer: Self-pay

## 2024-03-18 ENCOUNTER — Inpatient Hospital Stay (HOSPITAL_COMMUNITY)
Admission: EM | Admit: 2024-03-18 | Discharge: 2024-03-21 | DRG: 066 | Disposition: A | Discharge status: Home / Self Care | Payer: Self-pay | Attending: Internal Medicine | Admitting: Internal Medicine

## 2024-03-18 ENCOUNTER — Other Ambulatory Visit: Payer: Self-pay

## 2024-03-18 DIAGNOSIS — Z7902 Long term (current) use of antithrombotics/antiplatelets: Secondary | ICD-10-CM

## 2024-03-18 DIAGNOSIS — Y92511 Restaurant or cafe as the place of occurrence of the external cause: Secondary | ICD-10-CM

## 2024-03-18 DIAGNOSIS — E1165 Type 2 diabetes mellitus with hyperglycemia: Secondary | ICD-10-CM | POA: Diagnosis present

## 2024-03-18 DIAGNOSIS — T383X6A Underdosing of insulin and oral hypoglycemic [antidiabetic] drugs, initial encounter: Secondary | ICD-10-CM | POA: Diagnosis present

## 2024-03-18 DIAGNOSIS — R569 Unspecified convulsions: Secondary | ICD-10-CM | POA: Diagnosis present

## 2024-03-18 DIAGNOSIS — I639 Cerebral infarction, unspecified: Principal | ICD-10-CM | POA: Diagnosis present

## 2024-03-18 DIAGNOSIS — F17220 Nicotine dependence, chewing tobacco, uncomplicated: Secondary | ICD-10-CM | POA: Diagnosis present

## 2024-03-18 DIAGNOSIS — I6522 Occlusion and stenosis of left carotid artery: Secondary | ICD-10-CM | POA: Diagnosis present

## 2024-03-18 DIAGNOSIS — Z91141 Patient's other noncompliance with medication regimen due to financial hardship: Secondary | ICD-10-CM

## 2024-03-18 DIAGNOSIS — W1839XA Other fall on same level, initial encounter: Secondary | ICD-10-CM | POA: Diagnosis present

## 2024-03-18 DIAGNOSIS — R233 Spontaneous ecchymoses: Secondary | ICD-10-CM | POA: Diagnosis present

## 2024-03-18 DIAGNOSIS — Z7982 Long term (current) use of aspirin: Secondary | ICD-10-CM

## 2024-03-18 DIAGNOSIS — I63412 Cerebral infarction due to embolism of left middle cerebral artery: Principal | ICD-10-CM | POA: Diagnosis present

## 2024-03-18 DIAGNOSIS — Y990 Civilian activity done for income or pay: Secondary | ICD-10-CM

## 2024-03-18 DIAGNOSIS — R29700 NIHSS score 0: Secondary | ICD-10-CM | POA: Diagnosis present

## 2024-03-18 DIAGNOSIS — E785 Hyperlipidemia, unspecified: Secondary | ICD-10-CM | POA: Diagnosis present

## 2024-03-18 HISTORY — DX: Cerebral infarction, unspecified: I63.9

## 2024-03-18 LAB — CBG MONITORING, ED: Glucose-Capillary: 412 mg/dL — ABNORMAL HIGH (ref 70–99)

## 2024-03-18 NOTE — ED Triage Notes (Signed)
 Pt brought by EMS from Home. As per pt, He was just watching TV and became dizzy. EMS verbalized that the pt was confuse and his blood sugar was 475. EMS given 500ml of normal saline. Pt does take oral medication for his Diabetes. Latest Blood Sugar 412 pt alert and oriented at this time.

## 2024-03-19 ENCOUNTER — Emergency Department (HOSPITAL_COMMUNITY): Payer: Self-pay

## 2024-03-19 ENCOUNTER — Observation Stay (HOSPITAL_COMMUNITY): Payer: Self-pay

## 2024-03-19 ENCOUNTER — Encounter (HOSPITAL_COMMUNITY): Payer: Self-pay

## 2024-03-19 DIAGNOSIS — R233 Spontaneous ecchymoses: Secondary | ICD-10-CM

## 2024-03-19 DIAGNOSIS — F17220 Nicotine dependence, chewing tobacco, uncomplicated: Secondary | ICD-10-CM

## 2024-03-19 DIAGNOSIS — I63232 Cerebral infarction due to unspecified occlusion or stenosis of left carotid arteries: Secondary | ICD-10-CM

## 2024-03-19 DIAGNOSIS — F1210 Cannabis abuse, uncomplicated: Secondary | ICD-10-CM

## 2024-03-19 DIAGNOSIS — R569 Unspecified convulsions: Secondary | ICD-10-CM

## 2024-03-19 DIAGNOSIS — I6389 Other cerebral infarction: Secondary | ICD-10-CM

## 2024-03-19 DIAGNOSIS — Z794 Long term (current) use of insulin: Secondary | ICD-10-CM

## 2024-03-19 DIAGNOSIS — E1165 Type 2 diabetes mellitus with hyperglycemia: Secondary | ICD-10-CM

## 2024-03-19 DIAGNOSIS — G4089 Other seizures: Secondary | ICD-10-CM

## 2024-03-19 DIAGNOSIS — R29700 NIHSS score 0: Secondary | ICD-10-CM

## 2024-03-19 DIAGNOSIS — Z7984 Long term (current) use of oral hypoglycemic drugs: Secondary | ICD-10-CM

## 2024-03-19 DIAGNOSIS — I639 Cerebral infarction, unspecified: Principal | ICD-10-CM | POA: Diagnosis present

## 2024-03-19 LAB — CBC WITH DIFFERENTIAL/PLATELET
Abs Immature Granulocytes: 0.03 K/uL (ref 0.00–0.07)
Basophils Absolute: 0.1 K/uL (ref 0.0–0.1)
Basophils Relative: 1 %
Eosinophils Absolute: 0.1 K/uL (ref 0.0–0.5)
Eosinophils Relative: 1 %
HCT: 38.5 % — ABNORMAL LOW (ref 39.0–52.0)
Hemoglobin: 13.7 g/dL (ref 13.0–17.0)
Immature Granulocytes: 0 %
Lymphocytes Relative: 15 %
Lymphs Abs: 1.5 K/uL (ref 0.7–4.0)
MCH: 31.6 pg (ref 26.0–34.0)
MCHC: 35.6 g/dL (ref 30.0–36.0)
MCV: 88.7 fL (ref 80.0–100.0)
Monocytes Absolute: 0.9 K/uL (ref 0.1–1.0)
Monocytes Relative: 8 %
Neutro Abs: 7.7 K/uL (ref 1.7–7.7)
Neutrophils Relative %: 75 %
Platelets: 233 K/uL (ref 150–400)
RBC: 4.34 MIL/uL (ref 4.22–5.81)
RDW: 12.2 % (ref 11.5–15.5)
WBC: 10.3 K/uL (ref 4.0–10.5)
nRBC: 0 % (ref 0.0–0.2)

## 2024-03-19 LAB — LIPID PANEL
Cholesterol: 266 mg/dL — ABNORMAL HIGH (ref 0–200)
HDL: 51 mg/dL (ref >40)
LDL Cholesterol: 146 mg/dL — ABNORMAL HIGH (ref 0–99)
Total CHOL/HDL Ratio: 5.2 ratio
Triglycerides: 343 mg/dL — ABNORMAL HIGH (ref <150)
VLDL: 69 mg/dL — ABNORMAL HIGH (ref 0–40)

## 2024-03-19 LAB — BLOOD GAS, VENOUS
Acid-Base Excess: 2.1 mmol/L — ABNORMAL HIGH (ref 0.0–2.0)
Bicarbonate: 28.7 mmol/L — ABNORMAL HIGH (ref 20.0–28.0)
O2 Saturation: 79.8 %
Patient temperature: 37
pCO2, Ven: 52 mmHg (ref 44–60)
pH, Ven: 7.35 (ref 7.25–7.43)
pO2, Ven: 43 mmHg (ref 32–45)

## 2024-03-19 LAB — URINALYSIS, ROUTINE W REFLEX MICROSCOPIC
Bacteria, UA: NONE SEEN
Bilirubin Urine: NEGATIVE
Glucose, UA: >=500 mg/dL — AB
Hgb urine dipstick: NEGATIVE
Ketones, ur: NEGATIVE mg/dL
Leukocytes,Ua: NEGATIVE
Nitrite: NEGATIVE
Protein, ur: NEGATIVE mg/dL
Specific Gravity, Urine: 1.032 — ABNORMAL HIGH (ref 1.005–1.030)
pH: 6 (ref 5.0–8.0)

## 2024-03-19 LAB — ECHOCARDIOGRAM COMPLETE
Area-P 1/2: 3.42 cm2
Height: 66 in
S' Lateral: 2.3 cm
Weight: 2560 [oz_av]

## 2024-03-19 LAB — GLUCOSE, CAPILLARY
Glucose-Capillary: 216 mg/dL — ABNORMAL HIGH (ref 70–99)
Glucose-Capillary: 186 mg/dL — ABNORMAL HIGH (ref 70–99)

## 2024-03-19 LAB — AMMONIA: Ammonia: 52 umol/L — ABNORMAL HIGH (ref 9–35)

## 2024-03-19 LAB — COMPREHENSIVE METABOLIC PANEL WITH GFR
ALT: 8 U/L (ref 0–44)
AST: 12 U/L — ABNORMAL LOW (ref 15–41)
Albumin: 3.5 g/dL (ref 3.5–5.0)
Alkaline Phosphatase: 173 U/L — ABNORMAL HIGH (ref 38–126)
Anion gap: 9 (ref 5–15)
BUN: 13 mg/dL (ref 6–20)
CO2: 27 mmol/L (ref 22–32)
Calcium: 8.5 mg/dL — ABNORMAL LOW (ref 8.9–10.3)
Chloride: 98 mmol/L (ref 98–111)
Creatinine, Ser: 0.92 mg/dL (ref 0.61–1.24)
GFR, Estimated: >60 mL/min (ref >60)
Glucose, Bld: 436 mg/dL — ABNORMAL HIGH (ref 70–99)
Potassium: 4.2 mmol/L (ref 3.5–5.1)
Sodium: 134 mmol/L — ABNORMAL LOW (ref 135–145)
Total Bilirubin: 0.2 mg/dL (ref 0.0–1.2)
Total Protein: 6.4 g/dL — ABNORMAL LOW (ref 6.5–8.1)

## 2024-03-19 LAB — CBG MONITORING, ED
Glucose-Capillary: 327 mg/dL — ABNORMAL HIGH (ref 70–99)
Glucose-Capillary: 160 mg/dL — ABNORMAL HIGH (ref 70–99)
Glucose-Capillary: 154 mg/dL — ABNORMAL HIGH (ref 70–99)

## 2024-03-19 LAB — HEMOGLOBIN A1C
Hgb A1c MFr Bld: 10.3 % — ABNORMAL HIGH (ref 4.8–5.6)
Mean Plasma Glucose: 248.91 mg/dL

## 2024-03-19 LAB — ETHANOL: Alcohol, Ethyl (B): <15 mg/dL (ref <15)

## 2024-03-19 LAB — BETA-HYDROXYBUTYRIC ACID: Beta-Hydroxybutyric Acid: 0.1 mmol/L (ref 0.05–0.27)

## 2024-03-19 MED ORDER — LACTATED RINGERS IV BOLUS
2000.0000 mL | Freq: Once | INTRAVENOUS | Status: AC
  Administered 2024-03-19: 2000 mL via INTRAVENOUS

## 2024-03-19 MED ORDER — LEVETIRACETAM 500 MG PO TABS
500.0000 mg | ORAL_TABLET | Freq: Two times a day (BID) | ORAL | Status: DC
  Administered 2024-03-19 – 2024-03-21 (×4): 500 mg via ORAL
  Filled 2024-03-19 (×3): qty 1
  Filled 2024-03-19: qty 2

## 2024-03-19 MED ORDER — STROKE: EARLY STAGES OF RECOVERY BOOK
Freq: Once | Status: AC
  Filled 2024-03-19: qty 1

## 2024-03-19 MED ORDER — ASPIRIN 81 MG PO TBEC: 81.0000 mg | DELAYED_RELEASE_TABLET | Freq: Every day | ORAL | Status: DC

## 2024-03-19 MED ORDER — CLOPIDOGREL BISULFATE 75 MG PO TABS
75.0000 mg | ORAL_TABLET | Freq: Every day | ORAL | Status: DC
Start: 2024-03-19 — End: 2024-04-09
  Administered 2024-03-19 – 2024-03-21 (×3): 75 mg via ORAL
  Filled 2024-03-19 (×3): qty 1

## 2024-03-19 MED ORDER — CLOPIDOGREL BISULFATE 75 MG PO TABS: 75.0000 mg | ORAL_TABLET | Freq: Every day | ORAL | Status: DC

## 2024-03-19 MED ORDER — ASPIRIN 81 MG PO TBEC
81.0000 mg | DELAYED_RELEASE_TABLET | Freq: Every day | ORAL | Status: DC
  Administered 2024-03-19 – 2024-03-21 (×3): 81 mg via ORAL
  Filled 2024-03-19 (×3): qty 1

## 2024-03-19 MED ORDER — ATORVASTATIN CALCIUM 40 MG PO TABS
40.0000 mg | ORAL_TABLET | Freq: Every day | ORAL | Status: DC
  Administered 2024-03-19: 40 mg via ORAL
  Filled 2024-03-19: qty 1

## 2024-03-19 MED ORDER — INSULIN ASPART 100 UNIT/ML IJ SOLN
0.0000 [IU] | INTRAMUSCULAR | Status: DC
  Administered 2024-03-19: 2 [IU] via SUBCUTANEOUS
  Administered 2024-03-19 (×2): 1 [IU] via SUBCUTANEOUS
  Administered 2024-03-20: 2 [IU] via SUBCUTANEOUS
  Administered 2024-03-20 (×2): 1 [IU] via SUBCUTANEOUS
  Administered 2024-03-20: 2 [IU] via SUBCUTANEOUS
  Administered 2024-03-21: 1 [IU] via SUBCUTANEOUS
  Administered 2024-03-21: 2 [IU] via SUBCUTANEOUS
  Administered 2024-03-21 (×2): 1 [IU] via SUBCUTANEOUS
  Filled 2024-03-19 (×2): qty 1
  Filled 2024-03-19 (×2): qty 2
  Filled 2024-03-19 (×4): qty 1
  Filled 2024-03-19: qty 2
  Filled 2024-03-19: qty 1
  Filled 2024-03-19: qty 2

## 2024-03-19 MED ORDER — LEVETIRACETAM (KEPPRA) 500 MG/5 ML ADULT IV PUSH
1500.0000 mg | Freq: Once | INTRAVENOUS | Status: AC
  Administered 2024-03-19: 1500 mg via INTRAVENOUS
  Filled 2024-03-19: qty 15

## 2024-03-19 MED ORDER — IOHEXOL 350 MG/ML SOLN
75.0000 mL | Freq: Once | INTRAVENOUS | Status: AC | PRN
  Administered 2024-03-19: 75 mL via INTRAVENOUS

## 2024-03-19 MED ORDER — ATORVASTATIN CALCIUM 80 MG PO TABS
80.0000 mg | ORAL_TABLET | Freq: Every day | ORAL | Status: DC
  Administered 2024-03-20 – 2024-03-21 (×2): 80 mg via ORAL
  Filled 2024-03-19 (×2): qty 1

## 2024-03-19 MED ORDER — INSULIN ASPART 100 UNIT/ML IJ SOLN
8.0000 [IU] | Freq: Once | INTRAMUSCULAR | Status: AC
  Administered 2024-03-19: 8 [IU] via SUBCUTANEOUS
  Filled 2024-03-19: qty 8

## 2024-03-19 MED ORDER — CLOPIDOGREL BISULFATE 300 MG PO TABS: 300.0000 mg | ORAL_TABLET | Freq: Once | ORAL | Status: DC

## 2024-03-19 MED ORDER — ASPIRIN 325 MG PO TABS: 325.0000 mg | ORAL_TABLET | Freq: Once | ORAL | Status: DC

## 2024-03-19 NOTE — ED Provider Notes (Signed)
 [place] EMERGENCY DEPARTMENT AT [facility] Provider Note   CSN: [id number] Arrival date & time: 03/18/24  2335     Patient presents with: No chief complaint on file.   Bryan Mueller is a 53 y.o. male.  Patient with past history significant for uncontrolled type 2 diabetes presents to the emergency department with concerns of hyperglycemia and confusion.  Per his spouse, patient was reportedly watching TV and became somewhat disoriented and was looking at the ceiling.  At that time, EMS was called out and patient was found to have a blood sugar level of 475.  He states that he is not currently taking any medications for his diabetes.  States he is not taking anything for the last several years.  He does not explicitly answer as to why he is not currently taking medications.  Does endorse some increased urinary frequency but denies any abdominal pain, vomiting, or diarrhea  HPI     Prior to Admission medications   Medication Sig Start Date End Date Taking? Authorizing Provider  blood glucose meter kit and supplies Dispense based on patient and insurance preference. Use up to four times daily as directed. (FOR ICD-10 E10.9, E11.9). Patient not taking: Reported on 07/17/2023 12/17/21   Raenelle Coria, MD  glipiZIDE  (GLUCOTROL ) 5 MG tablet Take 1 tablet (5 mg total) by mouth 2 (two) times daily before a meal. 07/17/23   Arlon Carliss ORN, DO  Insulin  Pen Needle 32G X 4 MM MISC Use as directed Patient not taking: Reported on 07/17/2023 12/17/21   Raenelle Coria, MD  metFORMIN  (GLUCOPHAGE ) 1000 MG tablet Take 1 tablet (1,000 mg total) by mouth 2 (two) times daily with a meal. 07/17/23 08/16/23  Arlon Carliss ORN, DO  ondansetron  (ZOFRAN ) 4 MG tablet Take 1 tablet (4 mg total) by mouth every 6 (six) hours as needed for nausea. 07/17/23   Arlon Carliss ORN, DO    Allergies: Patient has no known allergies.    Review of Systems  Genitourinary:  Positive for frequency.   Psychiatric/Behavioral:  Positive for confusion.   All other systems reviewed and are negative.   Updated Vital Signs BP (!) 148/81 (BP Location: Right Arm)   Pulse 98   Temp 98.6 F (37 C) (Oral)   Resp 18   Ht 5' 6 (1.676 m)   Wt 72.6 kg   SpO2 100%   BMI 25.82 kg/m   Physical Exam Vitals and nursing note reviewed.  Constitutional:      General: He is not in acute distress.    Appearance: Normal appearance. He is well-developed. He is not ill-appearing.  HENT:     Head: Normocephalic and atraumatic.  Eyes:     Conjunctiva/sclera: Conjunctivae normal.  Cardiovascular:     Rate and Rhythm: Normal rate and regular rhythm.     Heart sounds: No murmur heard. Pulmonary:     Effort: Pulmonary effort is normal. No respiratory distress.     Breath sounds: Normal breath sounds.  Abdominal:     Palpations: Abdomen is soft.     Tenderness: There is no abdominal tenderness.  Musculoskeletal:        General: No swelling.     Cervical back: Neck supple.  Skin:    General: Skin is warm and dry.     Capillary Refill: Capillary refill takes 2 to 3 seconds.  Neurological:     General: No focal deficit present.     Mental Status: He is alert and oriented to  person, place, and time. Mental status is at baseline.     Sensory: No sensory deficit.     Motor: No weakness.     Comments: CN II-XII intact. No motor or sensor  Psychiatric:        Mood and Affect: Mood normal.     (all labs ordered are listed, but only abnormal results are displayed) Labs Reviewed  CBC WITH DIFFERENTIAL/PLATELET - Abnormal; Notable for the following components:      Result Value   HCT 38.5 (*)    All other components within normal limits  COMPREHENSIVE METABOLIC PANEL WITH GFR - Abnormal; Notable for the following components:   Sodium 134 (*)    Glucose, Bld 436 (*)    Calcium  8.5 (*)    Total Protein 6.4 (*)    AST 12 (*)    Alkaline Phosphatase 173 (*)    All other components within normal  limits  BLOOD GAS, VENOUS - Abnormal; Notable for the following components:   Bicarbonate 28.7 (*)    Acid-Base Excess 2.1 (*)    All other components within normal limits  URINALYSIS, ROUTINE W REFLEX MICROSCOPIC - Abnormal; Notable for the following components:   Specific Gravity, Urine 1.032 (*)    Glucose, UA >=500 (*)    All other components within normal limits  CBG MONITORING, ED - Abnormal; Notable for the following components:   Glucose-Capillary 412 (*)    All other components within normal limits  CBG MONITORING, ED - Abnormal; Notable for the following components:   Glucose-Capillary 327 (*)    All other components within normal limits  BETA-HYDROXYBUTYRIC ACID    EKG: None  Radiology: CT Head Wo Contrast Result Date: 03/19/2024 EXAM: CT HEAD WITHOUT CONTRAST 03/19/2024 01:47:35 AM TECHNIQUE: CT of the head was performed without the administration of intravenous contrast. Automated exposure control, iterative reconstruction, and/or weight based adjustment of the mA/kV was utilized to reduce the radiation dose to as low as reasonably achievable. COMPARISON: None available. CLINICAL HISTORY: Mental status change, unknown cause FINDINGS: BRAIN AND VENTRICLES: No acute hemorrhage. Age indeterminate infarct in the left frontal lobe (image 24) possibly acute. No hydrocephalus. No extra-axial collection. No mass effect or midline shift. ORBITS: No acute abnormality. SINUSES: No acute abnormality. SOFT TISSUES AND SKULL: No acute soft tissue abnormality. No skull fracture. IMPRESSION: 1. 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 Electronically signed by: Pinkie Pebbles MD 03/19/2024 01:52 AM EST RP Workstation: [id number]   DG Chest Portable 1 View Result Date: 03/19/2024 EXAM: 1 VIEW(S) XRAY OF THE CHEST 03/19/2024 12:45:00 AM COMPARISON: 09/07/2014 CLINICAL HISTORY: AMS FINDINGS: LUNGS AND PLEURA: Low lung volumes with vascular crowding. Mild bibasilar  opacities, likely atelectasis. No pulmonary edema. No pleural effusion. No pneumothorax. HEART AND MEDIASTINUM: No acute abnormality of the cardiac and mediastinal silhouettes. BONES AND SOFT TISSUES: No acute osseous abnormality. IMPRESSION: 1. Low lung volumes with bibasilar atelectasis. Electronically signed by: Pinkie Pebbles MD 03/19/2024 12:47 AM EST RP Workstation: [id number]     Procedures   Medications Ordered in the ED  lactated ringers  bolus 2,000 mL (2,000 mLs Intravenous Bolus 03/19/24 0035)  insulin  aspart (novoLOG ) injection 8 Units (8 Units Subcutaneous Given 03/19/24 0208)                                    Medical Decision Making Amount and/or Complexity of Data Reviewed Labs: ordered. Radiology: ordered.  Risk  Prescription drug management.   This patient presents to the ED for concern of confusion, hyperglycemia, this involves an extensive number of treatment options, and is a complaint that carries with it a high risk of complications and morbidity.  The differential diagnosis includes DKA, HHS, type 2 diabetes with hyperglycemia, mental status   Co morbidities that complicate the patient evaluation  Type 2 diabetes poorly controlled   Additional history obtained:  Additional history obtained from epic chart review   Lab Tests:  I Ordered, and personally interpreted labs.  The pertinent results include: CBG on arrival 412 after of fluids, CBC unremarkable, CMP with mild hyponatremia at 134, VBG with normal pH at 7.35, beta hydroxybutyric acid normal at 0.1, UA without ketonuria present, CBG after fluids and insulin  at 327.   Imaging Studies ordered:  I ordered imaging studies including chest x-ray, CT head I independently visualized and interpreted imaging which showed chest x-ray negative for any acute findings but low lung volumes, CT head concerning for age-indeterminate left frontal lobe infarct I agree with the radiologist  interpretation   Cardiac Monitoring: / EKG:  The patient was maintained on a cardiac monitor.  I personally viewed and interpreted the cardiac monitored which showed an underlying rhythm of: Sinus rhythm   Consultations Obtained:  I requested consultation with the neurology,  and discussed lab and imaging findings as well as pertinent plan - they recommend: Spoke with Dr. Vanessa, neurologist, who recommends MRI of the brain.  Did advise that hyperglycemia may have induced seizure even without clear seizure history.   Problem List / ED Course / Critical interventions / Medication management  Patient presents to the emergency department with concerns of altered mental status and hyperglycemia.  Patient has a history of type 2 diabetes currently uncontrolled with no medication use.  Was recently hospitalized for concerns of complications of hyperglycemia and discharged home.  He states that he has not taken any medications for his diabetes in the last 3 years.  His wife is at bedside reports concerns for possible confusion as she has noticed twice a day that while they are watching TV, patient had episodes where he would stare off and look at the ceiling and was somewhat confused but then returned back to baseline.  He otherwise endorses feelings of generalized fatigue and polyuria.  No nausea, vomiting, diarrhea, fever, chills or bodyaches.  Patient's wife reports that the episodes of confusion and where he stared off occurred at approximately 3 PM and 9 PM yesterday afternoon/evening. On exam, patient is well-appearing but appears to be tired.  He is easily arousable and answering all questions appropriately.  There is no focal abdominal tenderness.  There is no acute neurological deficits seen on examination. No appreciable difficulty with speech or understanding. Labs reveal hyperglycemia but no evidence of DKA.  Glucose improved to 327 after fluids and insulin .  pH at 7.35, no ketonuria, and  beta hydroxybutyric acid at 0.1.  Chest x-ray clear and unremarkable.  CT head concerning for an age-indeterminate left frontal lobe infarct.  Consult to neurology placed. Spoke with Dr. Khaliqdina, neurologist, who advised transfer to [facility] for MR brain for further assessment of patient's age-indeterminate infarct. Dr. Theadore, ED physician, is accepting for transfer. Due to concerns of confusion and hyperglycemia, will transfer via [facility]. I ordered medication including fluids, insulin   for hyperglycemia  Reevaluation of the patient after these medicines showed that the patient improved I have reviewed the patients home medicines and have made  adjustments as needed   Social Determinants of Health:  None   Test / Admission - Considered:  Final disposition per results of MR brain.  Final diagnoses:  None    ED Discharge Orders     None          Cecily Legrand DELENA DEVONNA 03/19/24 [id number]    Raford Lenis, MD 03/19/24 [phone]

## 2024-03-19 NOTE — TOC CAGE-AID Note (Signed)
 Transition of Care [facility]) - CAGE-AID Screening   Patient Details  Name: Perrion Marks MRN: [id number] Date of Birth: November 12, 1970  [facility] of [facility]) CM/SW Contact:    Carmelita FORBES Carbon, LCSW Phone Number: 03/19/2024, 3:08 PM   Clinical Narrative: No SA noted.   CAGE-AID Screening:    Have You Ever Felt You Ought to Cut Down on Your Drinking or Drug Use?: No Have People Annoyed You By Critizing Your Drinking Or Drug Use?: No Have You Felt Bad Or Guilty About Your Drinking Or Drug Use?: No Have You Ever Had a Drink or Used Drugs First Thing In The Morning to Steady Your Nerves or to Get Rid of a Hangover?: No CAGE-AID Score: 0  Substance Abuse Education Offered: No

## 2024-03-19 NOTE — Progress Notes (Signed)
   03/19/24 1321  TOC Brief Assessment  Insurance and Status Lapsed  Patient has primary care physician No  Home environment has been reviewed From home  Prior level of function: Independent  Prior/Current Home Services No current home services  Social Drivers of Health Review SDOH reviewed interventions complete  Readmission risk has been reviewed Yes  Transition of care needs no transition of care needs at this time   Spoke c/pt and wife at bedside. Wife states that the last time pt saw a doctor was in March. At that time he was given medication for diabetes. Pt has not followed-up or gotten refills on medication. Instructed on importance of daily management of diabetes. Clinics that see the uninsured added to AVS.

## 2024-03-19 NOTE — ED Provider Notes (Signed)
 Patient received in transfer from [facility]  He presented to the ED for acute onset of confusion this evening. He was found to have hyperglycemia in the setting of not taking any diabetic medications for the past several years.  CT of head showed age-indeterminate left frontal lobe infarct.  Neurology recommended MRI. Physical Exam  BP (!) 142/83 (BP Location: Right Arm)   Pulse 89   Temp 98.3 F (36.8 C) (Oral)   Resp 20   Ht 5' 6 (1.676 m)   Wt 72.6 kg   SpO2 100%   BMI 25.82 kg/m   Physical Exam Vitals and nursing note reviewed.  Constitutional:      General: He is not in acute distress.    Appearance: Normal appearance. He is well-developed. He is not ill-appearing, toxic-appearing or diaphoretic.  HENT:     Head: Normocephalic and atraumatic.     Right Ear: External ear normal.     Left Ear: External ear normal.     Nose: Nose normal.     Mouth/Throat:     Mouth: Mucous membranes are moist.  Eyes:     Extraocular Movements: Extraocular movements intact.     Conjunctiva/sclera: Conjunctivae normal.  Cardiovascular:     Rate and Rhythm: Normal rate and regular rhythm.  Pulmonary:     Effort: Pulmonary effort is normal. No respiratory distress.  Abdominal:     General: There is no distension.     Palpations: Abdomen is soft.  Musculoskeletal:        General: No swelling. Normal range of motion.     Cervical back: Normal range of motion and neck supple.  Skin:    General: Skin is warm and dry.     Coloration: Skin is not jaundiced or pale.  Neurological:     General: No focal deficit present.     Mental Status: He is alert and oriented to person, place, and time.     Cranial Nerves: No cranial nerve deficit.     Sensory: No sensory deficit.     Motor: No weakness.     Coordination: Coordination normal.  Psychiatric:        Mood and Affect: Mood normal.        Behavior: Behavior normal.     Procedures  Procedures  ED Course / MDM    Medical Decision  Making Amount and/or Complexity of Data Reviewed Labs: ordered. Radiology: ordered.  Risk Prescription drug management. Decision regarding hospitalization.   On assessment, patient resting comfortably.  He denies any complaints at this time.  His wife is present at bedside.  Wife reports that he had seizure-like episodes earlier in the day.  The first 1 occurred while at work.  Patient works as a investment banker, operational.  Patient's wife witnessed the second episode.  She describes him losing consciousness and having some convulsive activity.  She estimates duration of 5 minutes.  Patient denies any prior history of seizures.  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  I spoke with neurologist on-call, Dr. Vanessa, who will see in consult.  He has requested medicine admission for further stroke workup.  Patient was admitted for further management.       Melvenia Motto, MD 03/19/24 [phone]

## 2024-03-19 NOTE — ED Notes (Signed)
 Patient transported to CT

## 2024-03-19 NOTE — ED Notes (Signed)
 Pt transported to [place] via [facility]

## 2024-03-19 NOTE — Discharge Instructions (Addendum)
 Seizure precautions: Per [place]  DMV statutes, patients with seizures are not allowed to drive until they have been seizure-free for six months and cleared by a physician    Use caution when using heavy equipment or power tools. Avoid working on ladders or at heights. Take showers instead of baths. Ensure the water temperature is not too high on the home water heater. Do not go swimming alone. Do not lock yourself in a room alone (i.e. bathroom). When caring for infants or small children, sit down when holding, feeding, or changing them to minimize risk of injury to the child in the event you have a seizure. Maintain good sleep hygiene. Avoid alcohol.    If patient has another seizure, call 911 and bring them back to the ED if: A.  The seizure lasts longer than 5 minutes.      B.  The patient doesn't wake shortly after the seizure or has new problems such as difficulty seeing, speaking or moving following the seizure C.  The patient was injured during the seizure D.  The patient has a temperature over 102 F (39C) E.  The patient vomited during the seizure and now is having trouble breathing    During the Seizure   - First, ensure adequate ventilation and place patients on the floor on their left side  Loosen clothing around the neck and ensure the airway is patent. If the patient is clenching the teeth, do not force the mouth open with any object as this can cause severe damage - Remove all items from the surrounding that can be hazardous. The patient may be oblivious to what's happening and may not even know what he or she is doing. If the patient is confused and wandering, either gently guide him/her away and block access to outside areas - Reassure the individual and be comforting - Call 911. In most cases, the seizure ends before EMS arrives. However, there are cases when seizures may last over 3 to 5 minutes. Or the individual may have developed breathing difficulties or severe  injuries. If a pregnant patient or a person with diabetes develops a seizure, it is prudent to call an ambulance. - Finally, if the patient does not regain full consciousness, then call EMS. Most patients will remain confused for about 45 to 90 minutes after a seizure, so you must use judgment in calling for help. - Avoid restraints but make sure the patient is in a bed with padded side rails - Place the individual in a lateral position with the neck slightly flexed; this will help the saliva drain from the mouth and prevent the tongue from falling backward - Remove all nearby furniture and other hazards from the area - Provide verbal assurance as the individual is regaining consciousness - Provide the patient with privacy if possible - Call for help and start treatment as ordered by the caregiver    After the Seizure (Postictal Stage)   After a seizure, most patients experience confusion, fatigue, muscle pain and/or a headache. Thus, one should permit the individual to sleep. For the next few days, reassurance is essential. Being calm and helping reorient the person is also of importance.   Most seizures are painless and end spontaneously. Seizures are not harmful to others but can lead to complications such as stress on the lungs, brain and the heart. Individuals with prior lung problems may develop labored breathing and respiratory distress   Clinics for the Uninsured (Clnicas para personas sin seguro)  [facility] [address] [place], [place] [address] Hours of Operation Monday and Thursday 9AM - 8PM Tuesday and Wednesday 9AM - 5PM Friday, Saturday and Sunday Closed Phone [phone] (English and Spanish) Email [email or web address]  Providing primary, dental, and vision care, mental health services, and medication assistance to those who can't afford it.   [facility] [employer] [address] [place] Monday - Wednesday 8:30 AM - 5 PM Thursday  8:30 AM - 8 PM $5 per visit - Call for an eligibility appointment [phone]  [facility] [address] [place], [address] Monday - Friday 8:30 AM - 5:30 PM Saturdays 9 AM - 1 PM [phone] Serves patients from birth to age 9, providing well visits & sick child care - children who are chronically ill, developmentally delayed or affected by mental health issues.   [facility] [address] [place], [place] Monday - Friday 9 AM - 6 PM Walk-in Clinic Monday - Thursday 9 AM - 10:30 AM [phone] or [phone] *This clinic sees patients with or without insurance*  [facility] at [facility] [address] [place], [place] [address] [phone]  [facility] [address] Monday - Friday 9 AM - 1 PM 2nd & 4th Saturdays 9 AM - 1 PM Speak with Pam if you are self-pay (no insurance) [phone] or [phone]  [facility] at [facility] [address], [facility] Monday - Friday 8 AM - 5 PM On site pharmacy Monday - Friday 8 AM - 4:30 PM (closed for lunch 1-1:30 daily) As a [facility], they serve patients with or without insurance and connect families to necessary resources. Ph: [phone] Fax: [phone]  [facility] [address] [place], [place]  [phone] [address] [place], [place]  [phone] [email or web address] Monday, Tuesday, Thursday, Friday 8 AM - 5 PM Saturday 8 AM - 1 PM Wednesday - closed Medicare, Medicaid, most insurance For the uninsured, payment plans start at $60 Staff speaks Spanish (la personal habla espaol)  [facility] [address] [place]  [phone] [address], [facility]  [phone] Dr. [place]ina, $120 for self-pay (no insurance)   [facility] Phone: [phone] Text: [phone] Email: [email or web address] Offering first-rate mobile medical clinic, with space to comfortably and conveniently offer abdominal and trans-vaginal ultrasounds, pregnancy testing, counseling, and resources.  [facility] [address] [place], [place] [address] or [address] [place], [place] [address] [phone]  [facility] [address] [place], [place] [address] [phone] Providing quality holistic healthcare to the uninsured and underserved in greater [place]  To see if you qualify for government resources, please call: [facility] - [place], [place] [address], [place] [phone]

## 2024-03-19 NOTE — Progress Notes (Addendum)
 STROKE TEAM PROGRESS NOTE   INTERIM HISTORY/SUBJECTIVE Will likely need 30 day heart monitor He had an episode at 1500 yesterday while at work and was tired until around 7pm. At 2200 yesterday wife witnessed and his head was turned up and he was looking up in the ceiling. He stood up and turned bent over, wife led him to the ground.  Pt started to have whole body shaking and jerking, lasting 3-5 minutes.   He was not responding to questions. Patient has some recollection of this. EMS called and glucose was 475   OBJECTIVE  CBC    Component Value Date/Time   WBC 10.3 03/19/2024 0010   RBC 4.34 03/19/2024 0010   HGB 13.7 03/19/2024 0010   HCT 38.5 (L) 03/19/2024 0010   PLT 233 03/19/2024 0010   MCV 88.7 03/19/2024 0010   MCH 31.6 03/19/2024 0010   MCHC 35.6 03/19/2024 0010   RDW 12.2 03/19/2024 0010   LYMPHSABS 1.5 03/19/2024 0010   MONOABS 0.9 03/19/2024 0010   EOSABS 0.1 03/19/2024 0010   BASOSABS 0.1 03/19/2024 0010    BMET    Component Value Date/Time   NA 134 (L) 03/19/2024 0010   K 4.2 03/19/2024 0010   CL 98 03/19/2024 0010   CO2 27 03/19/2024 0010   GLUCOSE 436 (H) 03/19/2024 0010   BUN 13 03/19/2024 0010   CREATININE 0.92 03/19/2024 0010   CALCIUM  8.5 (L) 03/19/2024 0010   GFRNONAA >60 03/19/2024 0010    IMAGING past 24 hours CT ANGIO HEAD NECK W WO CM Result Date: 03/19/2024 EXAM: CTA HEAD AND NECK WITHOUT AND WITH 03/19/2024 07:17:16 AM TECHNIQUE: CTA of the head and neck was performed without and with the administration of intravenous contrast. Multiplanar 2D and/or 3D reformatted images are provided for review. Automated exposure control, iterative reconstruction, and/or weight based adjustment of the mA/kV was utilized to reduce the radiation dose to as low as reasonably achievable. Stenosis of the internal carotid arteries measured using NASCET criteria. 75 mL of iohexol  (OMNIPAQUE ) 350 MG/ML injection was administered. COMPARISON: Brain MRI and head CT earlier  today reported separately. 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 FINDINGS: CTA NECK: AORTIC ARCH AND ARCH VESSELS: Normal 3 vessel arch. No arch atherosclerosis. No dissection or arterial injury. No significant stenosis of the brachiocephalic or subclavian arteries. CERVICAL CAROTID ARTERIES: Right carotid bifurcation, proximal right ICA: Soft and calcified plaque with less than 50% stenosis. Left CCA origin: Soft more so than calcified plaque on series 5 image 166 without stenosis. Left carotid bifurcation, left ICA origin and bulb: Bulky low density soft more so than calcified plaque. Maximal stenosis on series 19 image 408 where the vessel is narrowed to 2 mm versus 4.8 mm normally, resulting in 58% stenosis. No dissection or arterial injury. CERVICAL VERTEBRAL ARTERIES: Right subclavian origin: Calcified plaque without stenosis. Dominant right vertebral artery: Normal to the skull base. There is mild right V4 segment calcified plaque without stenosis. The right vertebral supplies the basilar. Left vertebral artery: Diminutive, with a normal origin. 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 Left V3 segment is irregular in an area of prominent muscular branching on series 19 image 339, with evidence of soft plaque there. The left V3 and V4 segment appear atherosclerotic, and the left V4 segment is occluded in the posterior fossa. There is reconstitution of the left PICA from  the left AICA. No dissection or arterial injury. LUNGS AND MEDIASTINUM: Negative visible upper chest. SOFT TISSUES: Negative nonvascular neck soft tissue spaces. Intermittent carious dentition. BONES: Mild for age spine degeneration. CTA HEAD: ANTERIOR CIRCULATION: Left ICA siphon is patent with no significant plaque or stenosis. Right ICA siphon is patent and appears normal. MCA and ACA origins  are patent but irregular. Left MCA origin: Moderate stenosis along with irregularity of the proximal left M1 segment as seen on series 13 image 18. Distal left M1 remains patent. Left MCA bifurcation appears patent without stenosis. No left MCA branch occlusion is identified. Right MCA origin: Mildly stenotic. Right MCA M1 segment is mildly to moderately irregular without additional stenosis. Right MCA trifurcation is patent with moderate irregularity. No right MCA branch occlusion is identified. Right ACA A1 segment: Moderate to severe stenosis on series 15 image 21. Left ACA A2 segment: Dominant. Bilateral ACA branches are within normal limits. Normal anterior communicating artery. No aneurysm. POSTERIOR CIRCULATION: Basilar artery is patent without stenosis. Normal PCA origins. Right PCA P2 segment: Mildly to moderately irregular and stenotic on series 17 image 14. Left PCA branches are normal. Diminutive or absent posterior communicating arteries. Normal right PICA. No aneurysm. OTHER: Major dural venous sinuses are enhancing and appear to be patent. IMPRESSION: 1. Advanced intracranial atherosclerosis (see #4) with no emergent large vessel occlusion. 2. Positive for Bulky low-density plaque at the left carotid bifurcation and ICA origin causing approximately 58% stenosis. 3. 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 4. 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 Up to Moderate stenoses at the Left MCA origin, proximal left M1 segment, Right MCA trifurcation, and Right PCA P2 segment. Electronically signed by: Helayne Hurst MD 03/19/2024 07:32 AM EST RP Workstation: [id number]   MR BRAIN WO CONTRAST Result Date: 03/19/2024 EXAM: MRI BRAIN WITHOUT CONTRAST 03/19/2024 05:00:00 AM TECHNIQUE: Multiplanar multisequence MRI of the head/brain was performed without the administration of intravenous contrast. COMPARISON: Head CT comparison 06/06/2023  01:28:00 AM. CLINICAL HISTORY: 53 year old male. Neuro deficit, acute, stroke suspected. FINDINGS: BRAIN AND VENTRICLES: Preserved major vascular flow voids with dominant appearance of the distal right vertebral artery, a normal variant. Chronic encephalomalacia of the left middle frontal gyrus corresponding to the non-contrast CT finding today (series 6 image 43). Scattered additional foci of restricted diffusion including adjacent to that gyrus, and elsewhere in the distal left MCA territory extending cephalad from the superior left insula and operculum level (with 1 of the larger cortical areas of involvement on series 5 image 93 measuring 8 mm). Confluent petechial hemorrhage or chronic hemosiderin associated with the dominant nonacute lesion on series 12 image 53. T2 and FLAIR hyperintense cytotoxic edema in the areas of acute involvement. No malignant hemorrhagic transformation. No intracranial mass effect. No contralateral or posterior circulation restricted diffusion. No other cerebral blood products identified. Deep gray nuclei, brainstem and cerebellum appear negative. Minimal nonspecific right hemisphere white matter T2 and FLAIR hyperintensity. No mass. No midline shift. No hydrocephalus. The sella is unremarkable. ORBITS: No acute abnormality. SINUSES AND MASTOIDS: No acute abnormality. BONES AND SOFT TISSUES: Normal marrow signal. No acute soft tissue abnormality. Negative visible cervical spine. IMPRESSION: 1. Widely scattered small acute on nonacute left MCA territory infarcts (largest 8 mm). 2. Confluent petechial hemorrhage at the area of CT abnormality, which appears largely nonacute. No malignant hemorrhagic transformation or mass effect. 3. Negative for age non-contrast MRI appearance of the brain otherwise.  Electronically signed by: Helayne Hurst MD 03/19/2024 05:59 AM EST RP Workstation: [id number]   CT Head Wo Contrast Result Date: 03/19/2024 EXAM: CT HEAD WITHOUT CONTRAST 03/19/2024  01:47:35 AM TECHNIQUE: CT of the head was performed without the administration of intravenous contrast. Automated exposure control, iterative reconstruction, and/or weight based adjustment of the mA/kV was utilized to reduce the radiation dose to as low as reasonably achievable. COMPARISON: None available. CLINICAL HISTORY: Mental status change, unknown cause FINDINGS: BRAIN AND VENTRICLES: No acute hemorrhage. Age indeterminate infarct in the left frontal lobe (image 24) possibly acute. No hydrocephalus. No extra-axial collection. No mass effect or midline shift. ORBITS: No acute abnormality. SINUSES: No acute abnormality. SOFT TISSUES AND SKULL: No acute soft tissue abnormality. No skull fracture. IMPRESSION: 1. 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 Electronically signed by: Pinkie Pebbles MD 03/19/2024 01:52 AM EST RP Workstation: [id number]   DG Chest Portable 1 View Result Date: 03/19/2024 EXAM: 1 VIEW(S) XRAY OF THE CHEST 03/19/2024 12:45:00 AM COMPARISON: 09/07/2014 CLINICAL HISTORY: AMS FINDINGS: LUNGS AND PLEURA: Low lung volumes with vascular crowding. Mild bibasilar opacities, likely atelectasis. No pulmonary edema. No pleural effusion. No pneumothorax. HEART AND MEDIASTINUM: No acute abnormality of the cardiac and mediastinal silhouettes. BONES AND SOFT TISSUES: No acute osseous abnormality. IMPRESSION: 1. Low lung volumes with bibasilar atelectasis. Electronically signed by: Pinkie Pebbles MD 03/19/2024 12:47 AM EST RP Workstation: [id number]    Vitals:   03/19/24 0400 03/19/24 0600 03/19/24 0745 03/19/24 0801  BP: 139/81  123/81 120/70  Pulse: 88 85 88 85  Resp:    18  Temp:    98 F (36.7 C)  TempSrc:    Oral  SpO2: 100% 100% 100% 100%  Weight:      Height:         PHYSICAL EXAM General:  Alert, well-nourished, well-developed patient in no acute distress Psych:  Mood and affect appropriate for situation CV: Regular rate and rhythm on  monitor Respiratory:  Regular, unlabored respirations on room air GI: Abdomen soft and nontender   NEURO:  Mental Status: AA&Ox3, patient is able to give clear and coherent history Speech/Language: speech is without dysarthria or aphasia.  Naming, repetition, fluency, and comprehension intact.  Cranial Nerves:  II: PERRL. Visual fields full.  III, IV, VI: EOMI. Eyelids elevate symmetrically.  V: Sensation is intact to light touch and symmetrical to face.  VII: Slight right facial asymmetry  VIII: hearing intact to voice. IX, X: Palate elevates symmetrically. Phonation is normal.  KP:Dynloizm shrug 5/5. XII: tongue is midline without fasciculations. Motor: 5/5 strength to all muscle groups tested.  Tone: is normal and bulk is normal Sensation- Intact to light touch bilaterally. Extinction absent to light touch to DSS.   Coordination: FTN intact bilaterally, HKS: no ataxia in BLE.No drift.  Gait- deferred  Most Recent NIH 0     ASSESSMENT/PLAN  Bryan Mueller is a 53 y.o. male with history of DM2 that is poorly controlled who presents with confusion looking at the ceiling. He had an episode at 1500 yesterday and then at 2200 yesterda. Wife witnessed and his head was turned up and he was looking up in the ceiling. He was not responding to questions. No post ictal period. Patient has some recollection of this. EMS called and glucose was 475. He was iniitally brought to [facility] where CT Head was concerning for a small acute/subacute appearing L frontal stroke. He was transferred to [facility] for MRI Brain overnight which shows scattered  L MCA distribution strokes. NIH on Admission 0  Stroke:  left MCA scattered small/punctate infarcts, etiology: likely from left ICA stenosis and left MCA stenosis  Code Stroke CT head Age-indeterminate left frontal lobe infarct  CTA head & neck Positive for Bulky low-density plaque at the left carotid bifurcation and ICA origin causing approximately  58% stenosis. 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 Up to Moderate stenoses at the Left MCA origin, proximal left M1 segment, Right MCA trifurcation, and Right P2 MRI  Widely scattered small acute on nonacute left MCA territory infarcts  2D Echo EF 60-65% EEG - This study is within normal limits. No seizures or epileptiform discharges were seen throughout the recording.  LDL 146, TG 343 HgbA1c 9.5 UDS pending VTE prophylaxis - SCDs No antithrombotic prior to admission, now on aspirin 81 mg daily and clopidogrel 75 mg daily   Therapy recommendations:  Pending Disposition:  pending  Seizures 2 Episodes with eyes rolling back, head turning and shaking jerking, concerning for seizure EEG no seizure Keppra load of 1500mg  IV once, followed by 500mg  BID No driving for 6 months. Has to be seizure free for 6 months and under physician's care before he can resume driving Detailed seizure precautions listed below and also in the discharge instructions.  Carotid stenosis CTA neck showed Bulky low-density plaque at the left carotid bifurcation and ICA origin causing approximately 58% stenosis. Likely symptomatic at this time Carotid Doppler pending Will consider vascular surgery consultation  Diabetes type II Uncontrolled Home meds:  Insulin , metformin   HgbA1c 9.5, goal < 7.0 Hyperglycemia on admission CBGs SSI Recommend close follow-up with PCP for better DM control  Hyperlipidemia Home meds none LDL 146, TG 343 On Lipitor 80 Continue statin on discharge  Tobacco Abuse Patient chews tobacco       Ready to quit? Yes Nicotine replacement therapy provided  Substance Abuse Chronic THC user UDS pending Cessation education will be provided  Other stroke risk factors   Other acute medical issue Elevated ammonia at 52, management per primary team  Hospital day #  0    Discussed [place]  DMV statutes, patients with seizures are not allowed to drive until they have been seizure-free for six months Use caution when using heavy equipment or power tools. Avoid working on ladders or at heights. Take showers instead of baths. Ensure the water temperature is not too high on the home water heater. Do not go swimming alone. Do not lock yourself in a room alone (i.e. bathroom). When caring for infants or small children, sit down when holding, feeding, or changing them to minimize risk of injury to the child in the event you have a seizure. Maintain good sleep hygiene. Avoid alcohol. After a seizure, most patients experience confusion, fatigue, muscle pain and/or a headache. Thus, one should permit the individual to sleep. For the next few days, reassurance is essential. Being calm and helping reorient the person is also of importance.   Most seizures are painless and end spontaneously. Seizures are not harmful to others but can lead to complications such as stress on the lungs, brain and the heart. Individuals with prior lung problems may develop labored breathing and respiratory distress.    ATTENDING NOTE: I reviewed above note and agree with the assessment and plan. Pt was seen and examined.   Wife at bedside.  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  Patient subsequently drowsy sleepy, wife brought him home and back to baseline around 7 PM.  And then at night he was watching TV at 10 PM, started to get up from chair, turn around, bent over to chair, then head turing to right with eyes staring up, LOC and falling backward. Wife caught him and put on the ground. Pt started to have whole body shaking and jerking, lasted about 5 minutes.  EMS called and patient sent to ED for evaluation.   Glucose was high at 475.  Loaded with Keppra in the ER.  No more seizure activity since.     On exam, patient awake, alert, eyes open, orientated to age, place, time and people. No aphasia, fluent language, following all simple commands, with accent but no dysarthria per wife. Able to name and repeat. No gaze palsy, tracking bilaterally, visual field full. No facial droop. Tongue midline. Bilateral UEs 5/5, no drift. Bilaterally LEs 5/5, no drift. Sensation symmetrical bilaterally, b/l FTN intact, gait not tested.   For detailed assessment and plan, please refer to above as I have made changes wherever appropriate.   Ary Cummins, MD PhD Stroke Neurology 03/19/2024 6:38 PM  I spent additional inpatient 35 minutes face-to-face time with the patient and family, reviewing test results, images and medication, and discussing the diagnosis, treatment plan and potential prognosis. This patient's care requiresreview of multiple databases, neurological assessment, discussion with family, other specialists and medical decision making of high complexity. I had long discussion with patient and wife at bedside, updated pt current condition, treatment plan and potential prognosis, and answered all the questions.  They expressed understanding and appreciation.         To contact Stroke Continuity provider, please refer to [email or web address]. After hours, contact General Neurology

## 2024-03-19 NOTE — Procedures (Signed)
 Patient Name: Bryan Mueller  MRN: [id number]  Epilepsy Attending: Merla Sawka O Makinlee Awwad  Referring Physician/Provider: Khaliqdina, Salman, MD  Date: 03/19/2024 Duration: 23.05 mins  Patient history: 53 y.o. male with hx of DM2 that is poorly controlled who presents with confusion looking at the ceiling. EEG to evaluate for seizure  Level of alertness: Awake, asleep  AEDs during EEG study: LEV  Technical aspects: This EEG study was done with scalp electrodes positioned according to the 10-20 International system of electrode placement. Electrical activity was reviewed with band pass filter of 1-70Hz , sensitivity of 7 uV/mm, display speed of 51mm/sec with a 60Hz  notched filter applied as appropriate. EEG data were recorded continuously and digitally stored.  Video monitoring was available and reviewed as appropriate.  Description: The posterior dominant rhythm consists of 8-9Hz  activity of moderate voltage (25-35 uV) seen predominantly in posterior head regions, symmetric and reactive to eye opening and eye closing. Sleep was characterized by vertex waves, sleep spindles (12 to 14 Hz), maximal frontocentral region. Hyperventilation and photic stimulation were not performed.     IMPRESSION: This study is within normal limits. No seizures or epileptiform discharges were seen throughout the recording.  A normal interictal EEG does not exclude the diagnosis of epilepsy.   Landi Biscardi O Seeley Hissong

## 2024-03-19 NOTE — H&P (Signed)
 History and Physical    Patient: Bryan Mueller DOB: March 31, 1971 DOA: 03/18/2024 DOS: the patient was seen and examined on 03/19/2024 PCP: Patient, No Pcp Per  Patient coming from: Home  Chief Complaint:  Chief Complaint  Patient presents with   Altered Mental Status   Hyperglycemia   HPI: Bryan Mueller is a 53 y.o. male with medical history significant of DM2 p/w acute stroke.  The patient experienced an altered mental status episode at work, characterized by eye rolling and falling down. Following the event, the patient went home with assistance. At home, another episode occurred at 1500 and 2200, involving shaking, laying down, and staring at the ceiling. An attempt was made to lay the patient down, but an ambulance was called, and the patient was brought to the hospital.   In the ED, pt hypertensive. Labs notable for glucose 436 (beta-hydroxybuterate 0.10 wnl), and ammonia 52. 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 MRI brain WO showed widely scattered small acute on nonacute left MCA territory infarcts (largest 8 mm). EDP consulted Neurology and requested medicine admission.   Review of Systems: As mentioned in the history of present illness. All other systems reviewed and are negative. Past Medical History:  Diagnosis Date   Cellulitis    Diabetes mellitus, type 2 (HCC)    History reviewed. No pertinent surgical history. Social History:  reports that he has never smoked. His smokeless tobacco use includes chew. He reports that he does not currently use alcohol. He reports that he does not use drugs.  No Known Allergies  History reviewed. No pertinent family history.  Prior to Admission medications   Medication Sig Start Date End Date Taking? Authorizing Provider  blood glucose meter kit and supplies Dispense based on patient and insurance preference. Use up to four times daily as directed. (FOR ICD-10 E10.9, E11.9). Patient not taking: Reported  on 07/17/2023 12/17/21   Raenelle Coria, MD  glipiZIDE  (GLUCOTROL ) 5 MG tablet Take 1 tablet (5 mg total) by mouth 2 (two) times daily before a meal. Patient not taking: Reported on 03/19/2024 07/17/23   Arlon Carliss ORN, DO  Insulin  Pen Needle 32G X 4 MM MISC Use as directed Patient not taking: Reported on 07/17/2023 12/17/21   Raenelle Coria, MD  metFORMIN  (GLUCOPHAGE ) 1000 MG tablet Take 1 tablet (1,000 mg total) by mouth 2 (two) times daily with a meal. Patient not taking: Reported on 03/19/2024 07/17/23 08/16/23  Arlon Carliss ORN, DO    Physical Exam: Vitals:   03/19/24 0400 03/19/24 0600 03/19/24 0745 03/19/24 0801  BP: 139/81  123/81 120/70  Pulse: 88 85 88 85  Resp:    18  Temp:    98 F (36.7 C)  TempSrc:    Oral  SpO2: 100% 100% 100% 100%  Weight:      Height:       General: Alert, oriented x3, resting comfortably in no acute distress HEENT: EOMI, oropharynx clear, moist mucous membranes, hearing intact Neck: Trachea midline and no gross thyromegaly Respiratory: Lungs clear to auscultation bilaterally with normal respiratory effort; no w/r/r Cardiovascular: Regular rate and rhythm w/o m/r/g Abdomen: Soft, nontender, nondistended. Positive bowel sounds MSK: No obvious joint deformities or swelling Skin: No obvious rashes or lesions Neurologic: Awake, alert, spontaneously moves all extremities, strength intact Psychiatric: Appropriate mood and affect, conversational and cooperative   Data Reviewed:  Lab Results  Component Value Date   WBC 10.3 03/19/2024   HGB 13.7 03/19/2024   HCT 38.5 (L) 03/19/2024  MCV 88.7 03/19/2024   PLT 233 03/19/2024   Lab Results  Component Value Date   GLUCOSE 436 (H) 03/19/2024   CALCIUM  8.5 (L) 03/19/2024   NA 134 (L) 03/19/2024   K 4.2 03/19/2024   CO2 27 03/19/2024   CL 98 03/19/2024   BUN 13 03/19/2024   CREATININE 0.92 03/19/2024   Lab Results  Component Value Date   ALT 8 03/19/2024   AST 12 (L) 03/19/2024   ALKPHOS 173  (H) 03/19/2024   BILITOT 0.2 03/19/2024   Lab Results  Component Value Date   INR 0.91 09/07/2014   Radiology: EEG adult Result Date: 03/19/2024 Shelton Arlin KIDD, MD     03/19/2024 11:16 AM Patient Name: Bryan Mueller MRN: [id number] Epilepsy Attending: Arlin KIDD Shelton Referring Physician/Provider: Vanessa Robert, MD Date: 03/19/2024 Duration: 23.05 mins Patient history: 53 y.o. male with hx of DM2 that is poorly controlled who presents with confusion looking at the ceiling. EEG to evaluate for seizure Level of alertness: Awake, asleep AEDs during EEG study: LEV Technical aspects: This EEG study was done with scalp electrodes positioned according to the 10-20 International system of electrode placement. Electrical activity was reviewed with band pass filter of 1-70Hz , sensitivity of 7 uV/mm, display speed of 67mm/sec with a 60Hz  notched filter applied as appropriate. EEG data were recorded continuously and digitally stored.  Video monitoring was available and reviewed as appropriate. Description: The posterior dominant rhythm consists of 8-9Hz  activity of moderate voltage (25-35 uV) seen predominantly in posterior head regions, symmetric and reactive to eye opening and eye closing. Sleep was characterized by vertex waves, sleep spindles (12 to 14 Hz), maximal frontocentral region. Hyperventilation and photic stimulation were not performed.   IMPRESSION: This study is within normal limits. No seizures or epileptiform discharges were seen throughout the recording. A normal interictal EEG does not exclude the diagnosis of epilepsy. Priyanka KIDD Shelton   CT ANGIO HEAD NECK W WO CM Result Date: 03/19/2024 EXAM: CTA HEAD AND NECK WITHOUT AND WITH 03/19/2024 07:17:16 AM TECHNIQUE: CTA of the head and neck was performed without and with the administration of intravenous contrast. Multiplanar 2D and/or 3D reformatted images are provided for review. Automated exposure control, iterative reconstruction, and/or  weight based adjustment of the mA/kV was utilized to reduce the radiation dose to as low as reasonably achievable. Stenosis of the internal carotid arteries measured using NASCET criteria. 75 mL of iohexol  (OMNIPAQUE ) 350 MG/ML injection was administered. COMPARISON: Brain MRI and head CT earlier today reported separately. 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 FINDINGS: CTA NECK: AORTIC ARCH AND ARCH VESSELS: Normal 3 vessel arch. No arch atherosclerosis. No dissection or arterial injury. No significant stenosis of the brachiocephalic or subclavian arteries. CERVICAL CAROTID ARTERIES: Right carotid bifurcation, proximal right ICA: Soft and calcified plaque with less than 50% stenosis. Left CCA origin: Soft more so than calcified plaque on series 5 image 166 without stenosis. Left carotid bifurcation, left ICA origin and bulb: Bulky low density soft more so than calcified plaque. Maximal stenosis on series 19 image 408 where the vessel is narrowed to 2 mm versus 4.8 mm normally, resulting in 58% stenosis. No dissection or arterial injury. CERVICAL VERTEBRAL ARTERIES: Right subclavian origin: Calcified plaque without stenosis. Dominant right vertebral artery: Normal to the skull base. There is mild right V4 segment calcified plaque without stenosis. The right vertebral supplies the basilar. Left vertebral artery: Diminutive, with a normal  origin. 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 Left V3 segment is irregular in an area of prominent muscular branching on series 19 image 339, with evidence of soft plaque there. The left V3 and V4 segment appear atherosclerotic, and the left V4 segment is occluded in the posterior fossa. There is reconstitution of the left PICA from the left AICA. No dissection or arterial injury. LUNGS AND MEDIASTINUM: Negative visible upper chest.  SOFT TISSUES: Negative nonvascular neck soft tissue spaces. Intermittent carious dentition. BONES: Mild for age spine degeneration. CTA HEAD: ANTERIOR CIRCULATION: Left ICA siphon is patent with no significant plaque or stenosis. Right ICA siphon is patent and appears normal. MCA and ACA origins are patent but irregular. Left MCA origin: Moderate stenosis along with irregularity of the proximal left M1 segment as seen on series 13 image 18. Distal left M1 remains patent. Left MCA bifurcation appears patent without stenosis. No left MCA branch occlusion is identified. Right MCA origin: Mildly stenotic. Right MCA M1 segment is mildly to moderately irregular without additional stenosis. Right MCA trifurcation is patent with moderate irregularity. No right MCA branch occlusion is identified. Right ACA A1 segment: Moderate to severe stenosis on series 15 image 21. Left ACA A2 segment: Dominant. Bilateral ACA branches are within normal limits. Normal anterior communicating artery. No aneurysm. POSTERIOR CIRCULATION: Basilar artery is patent without stenosis. Normal PCA origins. Right PCA P2 segment: Mildly to moderately irregular and stenotic on series 17 image 14. Left PCA branches are normal. Diminutive or absent posterior communicating arteries. Normal right PICA. No aneurysm. OTHER: Major dural venous sinuses are enhancing and appear to be patent. IMPRESSION: 1. Advanced intracranial atherosclerosis (see #4) with no emergent large vessel occlusion. 2. Positive for Bulky low-density plaque at the left carotid bifurcation and ICA origin causing approximately 58% stenosis. 3. 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 4. 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 Up to Moderate stenoses at the Left MCA origin, proximal left M1 segment, Right MCA trifurcation, and Right PCA P2 segment. Electronically signed by: Helayne Hurst MD 03/19/2024 07:32 AM EST RP  Workstation: [id number]   MR BRAIN WO CONTRAST Result Date: 03/19/2024 EXAM: MRI BRAIN WITHOUT CONTRAST 03/19/2024 05:00:00 AM TECHNIQUE: Multiplanar multisequence MRI of the head/brain was performed without the administration of intravenous contrast. COMPARISON: Head CT comparison 06/06/2023 01:28:00 AM. CLINICAL HISTORY: 53 year old male. Neuro deficit, acute, stroke suspected. FINDINGS: BRAIN AND VENTRICLES: Preserved major vascular flow voids with dominant appearance of the distal right vertebral artery, a normal variant. Chronic encephalomalacia of the left middle frontal gyrus corresponding to the non-contrast CT finding today (series 6 image 43). Scattered additional foci of restricted diffusion including adjacent to that gyrus, and elsewhere in the distal left MCA territory extending cephalad from the superior left insula and operculum level (with 1 of the larger cortical areas of involvement on series 5 image 93 measuring 8 mm). Confluent petechial hemorrhage or chronic hemosiderin associated with the dominant nonacute lesion on series 12 image 53. T2 and FLAIR hyperintense cytotoxic edema in the areas of acute involvement. No malignant hemorrhagic transformation. No intracranial mass effect. No contralateral or posterior circulation restricted diffusion. No other cerebral blood products identified. Deep gray nuclei, brainstem and cerebellum appear negative. Minimal nonspecific right hemisphere white matter T2 and FLAIR hyperintensity. No mass. No midline shift. No hydrocephalus. The sella is unremarkable. ORBITS: No acute abnormality. SINUSES  AND MASTOIDS: No acute abnormality. BONES AND SOFT TISSUES: Normal marrow signal. No acute soft tissue abnormality. Negative visible cervical spine. IMPRESSION: 1. Widely scattered small acute on nonacute left MCA territory infarcts (largest 8 mm). 2. Confluent petechial hemorrhage at the area of CT abnormality, which appears largely nonacute. No malignant  hemorrhagic transformation or mass effect. 3. Negative for age non-contrast MRI appearance of the brain otherwise. Electronically signed by: Helayne Hurst MD 03/19/2024 05:59 AM EST RP Workstation: [id number]   CT Head Wo Contrast Result Date: 03/19/2024 EXAM: CT HEAD WITHOUT CONTRAST 03/19/2024 01:47:35 AM TECHNIQUE: CT of the head was performed without the administration of intravenous contrast. Automated exposure control, iterative reconstruction, and/or weight based adjustment of the mA/kV was utilized to reduce the radiation dose to as low as reasonably achievable. COMPARISON: None available. CLINICAL HISTORY: Mental status change, unknown cause FINDINGS: BRAIN AND VENTRICLES: No acute hemorrhage. Age indeterminate infarct in the left frontal lobe (image 24) possibly acute. No hydrocephalus. No extra-axial collection. No mass effect or midline shift. ORBITS: No acute abnormality. SINUSES: No acute abnormality. SOFT TISSUES AND SKULL: No acute soft tissue abnormality. No skull fracture. IMPRESSION: 1. 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 Electronically signed by: Pinkie Pebbles MD 03/19/2024 01:52 AM EST RP Workstation: [id number]   DG Chest Portable 1 View Result Date: 03/19/2024 EXAM: 1 VIEW(S) XRAY OF THE CHEST 03/19/2024 12:45:00 AM COMPARISON: 09/07/2014 CLINICAL HISTORY: AMS FINDINGS: LUNGS AND PLEURA: Low lung volumes with vascular crowding. Mild bibasilar opacities, likely atelectasis. No pulmonary edema. No pleural effusion. No pneumothorax. HEART AND MEDIASTINUM: No acute abnormality of the cardiac and mediastinal silhouettes. BONES AND SOFT TISSUES: No acute osseous abnormality. IMPRESSION: 1. Low lung volumes with bibasilar atelectasis. Electronically signed by: Pinkie Pebbles MD 03/19/2024 12:47 AM EST RP Workstation: [id number]    Assessment and Plan: 14M h/o DM2 p/w acute stroke and possible focal seizures.  Acute stroke -Neuro following; appreciate  eval/recs -PT/OT/SLP following; appreciate recs -Cardiac telemetry; consider 30d holter monitor at d/c if no arrhythmias captures -Allow permissive HTN (220/120 due to acute stroke) -Risk factor modification -Frequent neuro checks per protocol -F/u HgbA1c, fasting lipid panel -F/u TTE  Staring spell c/f focal seizure -Neurology consulted; apprec eval/recs (agree w/ Keppra load and Keppra for maintenance)  DM2 -Diabetes coordinator consulted; apprec eval/recs -SSI alone for now   Advance Care Planning:   Code Status: Full Code   Consults: Neurology  Family Communication: Wife  Severity of Illness: The appropriate patient status for this patient is INPATIENT. Inpatient status is judged to be reasonable and necessary in order to provide the required intensity of service to ensure the patient's safety. The patient's presenting symptoms, physical exam findings, and initial radiographic and laboratory data in the context of their chronic comorbidities is felt to place them at high risk for further clinical deterioration. Furthermore, it is not anticipated that the patient will be medically stable for discharge from the hospital within 2 midnights of admission.   * I certify that at the point of admission it is my clinical judgment that the patient will require inpatient hospital care spanning beyond 2 midnights from the point of admission due to high intensity of service, high risk for further deterioration and high frequency of surveillance required.*   ------- I spent 57 minutes reviewing previous notes, at the bedside counseling/discussing the treatment plan, and performing clinical documentation.  Author: Marsha Ada, MD 03/19/2024 11:49 AM  For on call review [email or web address].

## 2024-03-19 NOTE — Plan of Care (Signed)

## 2024-03-19 NOTE — Consult Note (Signed)
 NEUROLOGY CONSULT NOTE   Date of service: March 19, 2024 Patient Name: Bryan Mueller MRN:  [id number] DOB:  03/27/1971 Chief Complaint: confusion and hyperglycemia Requesting Provider: Melvenia Motto, MD  History of Present Illness  Bryan Mueller is a 53 y.o. male with hx of DM2 that is poorly controlled who presents with confusion looking at the ceiling. He had an episode at 1500 yesterday and then at 2200 yesterda. Wife witnessed and his head was turned up and he was looking up in the ceiling. He was not responding to questions. No post ictal period. Patient has some recollection of this. EMS called and glucose was 475. He was iniitally brought to [facility] where CT Head was concerning for a small acute/subacute appearing L frontal stroke. He was transferred to [facility] for MRI Brain overnight which shows scattered L MCA distribution strokes.  He reports non compliance with DM2 meds due to inability to afford. He denies prior strokes, no family hx of strokes. Uses chewing tobacco and marijuana occasionally. Does not use any other recreational substances.  LKW: 1500 Modified rankin score: 0-Completely asymptomatic and back to baseline post- stroke IV Thrombolysis: not offered, no symptoms at this time. EVT: not offered, no symptoms at this time.  NIHSS components Score: Comment  1a Level of Conscious 0[]  1[]  2[]  3[]      1b LOC Questions 0[]  1[]  2[]       1c LOC Commands 0[]  1[]  2[]       2 Best Gaze 0[]  1[]  2[]       3 Visual 0[]  1[]  2[]  3[]      4 Facial Palsy 0[]  1[]  2[]  3[]      5a Motor Arm - left 0[]  1[]  2[]  3[]  4[]  UN[]    5b Motor Arm - Right 0[]  1[]  2[]  3[]  4[]  UN[]    6a Motor Leg - Left 0[]  1[]  2[]  3[]  4[]  UN[]    6b Motor Leg - Right 0[]  1[]  2[]  3[]  4[]  UN[]    7 Limb Ataxia 0[]  1[]  2[]  UN[]      8 Sensory 0[]  1[]  2[]  UN[]      9 Best Language 0[]  1[]  2[]  3[]      10 Dysarthria 0[]  1[]  2[]  UN[]      11 Extinct. and Inattention 0[]  1[]  2[]       TOTAL: 0      ROS  Comprehensive ROS  performed and pertinent positives documented in HPI   Past History   Past Medical History:  Diagnosis Date   Cellulitis    Diabetes mellitus, type 2 (HCC)     History reviewed. No pertinent surgical history.  Family History: History reviewed. No pertinent family history.  Social History  reports that he has never smoked. His smokeless tobacco use includes chew. He reports that he does not currently use alcohol. He reports that he does not use drugs.  No Known Allergies  Medications  No current facility-administered medications for this encounter.  Current Outpatient Medications:    blood glucose meter kit and supplies, Dispense based on patient and insurance preference. Use up to four times daily as directed. (FOR ICD-10 E10.9, E11.9). (Patient not taking: Reported on 07/17/2023), Disp: 1 each, Rfl: 0   glipiZIDE  (GLUCOTROL ) 5 MG tablet, Take 1 tablet (5 mg total) by mouth 2 (two) times daily before a meal., Disp: 30 tablet, Rfl: 1   Insulin  Pen Needle 32G X 4 MM MISC, Use as directed (Patient not taking: Reported on 07/17/2023), Disp: 100 each, Rfl: 0   metFORMIN  (GLUCOPHAGE ) 1000 MG tablet, Take  1 tablet (1,000 mg total) by mouth 2 (two) times daily with a meal., Disp: 60 tablet, Rfl: 0   ondansetron  (ZOFRAN ) 4 MG tablet, Take 1 tablet (4 mg total) by mouth every 6 (six) hours as needed for nausea., Disp: 20 tablet, Rfl: 0  Vitals   Vitals:   2024-04-01 0315 Apr 01, 2024 0358 04-01-24 0400 April 01, 2024 0600  BP: 123/67 (!) 142/83 139/81   Pulse: 95 89 88 85  Resp: (!) 25 20    Temp:  98.3 F (36.8 C)    TempSrc:  Oral    SpO2: 100% 100% 100% 100%  Weight:      Height:        Body mass index is 25.82 kg/m.   Physical Exam   General: Laying comfortably in bed; in no acute distress.  HENT: Normal oropharynx and mucosa. Normal external appearance of ears and nose.  Neck: Supple, no pain or tenderness  CV: No JVD. No peripheral edema.  Pulmonary: Symmetric Chest rise. Normal  respiratory effort.  Abdomen: Soft to touch, non-tender.  Ext: No cyanosis, edema, or deformity  Skin: No rash. Normal palpation of skin.   Musculoskeletal: Normal digits and nails by inspection. No clubbing.   Neurologic Examination  Mental status/Cognition: Alert, oriented to self, place, month and year, good attention.  Speech/language: Fluent, comprehension intact, object naming intact, repetition intact.  Cranial nerves:   CN II Pupils equal and reactive to light, no VF deficits    CN III,IV,VI EOM intact, no gaze preference or deviation, no nystagmus    CN V normal sensation in V1, V2, and V3 segments bilaterally    CN VII no asymmetry, no nasolabial fold flattening    CN VIII normal hearing to speech    CN IX & X normal palatal elevation, no uvular deviation    CN XI 5/5 head turn and 5/5 shoulder shrug bilaterally    CN XII midline tongue protrusion    Motor:  Muscle bulk: normal, tone normal, pronator drift none tremor none Mvmt Root Nerve  Muscle Right Left Comments  SA C5/6 Ax Deltoid 5 5   EF C5/6 Mc Biceps 5 5   EE C6/7/8 Rad Triceps 5 5   WF C6/7 Med FCR     WE C7/8 PIN ECU     F Ab C8/T1 U ADM/FDI 5 5   HF L1/2/3 Fem Illopsoas 5 5   KE L2/3/4 Fem Quad 5 5   DF L4/5 D Peron Tib Ant 5 5   PF S1/2 Tibial Grc/Sol 5 5    Sensation:  Light touch Intact throughout   Pin prick    Temperature    Vibration   Proprioception    Coordination/Complex Motor:  - Finger to Nose intact BL - Heel to shin intact BL - Rapid alternating movement are normal - Gait: deferred.  Labs/Imaging/Neurodiagnostic studies   CBC:  Recent Labs  Lab 04-01-2024 0010  WBC 10.3  NEUTROABS 7.7  HGB 13.7  HCT 38.5*  MCV 88.7  PLT 233   Basic Metabolic Panel:  Lab Results  Component Value Date   NA 134 (L) Apr 01, 2024   K 4.2 Apr 01, 2024   CO2 27 04-01-24   GLUCOSE 436 (H) 2024-04-01   BUN 13 April 01, 2024   CREATININE 0.92 04/01/2024   CALCIUM  8.5 (L) Apr 01, 2024   GFRNONAA >60  Apr 01, 2024   GFRAA >60 06/02/2018   Lipid Panel: No results found for: LDLCALC HgbA1c:  Lab Results  Component Value Date   HGBA1C  9.5 (H) 07/17/2023   Urine Drug Screen:     Component Value Date/Time   LABOPIA NONE DETECTED 07/16/2023 2343   COCAINSCRNUR NONE DETECTED 07/16/2023 2343   LABBENZ NONE DETECTED 07/16/2023 2343   AMPHETMU NONE DETECTED 07/16/2023 2343   THCU NONE DETECTED 07/16/2023 2343   LABBARB NONE DETECTED 07/16/2023 2343    Alcohol Level     Component Value Date/Time   ETH <15 03/19/2024 0503   INR  Lab Results  Component Value Date   INR 0.91 09/07/2014   APTT  Lab Results  Component Value Date   APTT 32 09/07/2014   AED levels: No results found for: PHENYTOIN, ZONISAMIDE, LAMOTRIGINE, LEVETIRACETA  CT Head without contrast(Personally reviewed): Age-indeterminate left frontal lobe infarct, possibly acute.   CT angio Head and Neck with contrast: pending  MRI Brain(Personally reviewed): 1. Widely scattered small acute on nonacute left MCA territory infarcts (largest 8 mm). 2. Confluent petechial hemorrhage at the area of CT abnormality, which appears largely nonacute. No malignant hemorrhagic transformation or mass effect. 3. Negative for age non-contrast MRI appearance of the brain otherwise.  Neurodiagnostics rEEG:  pending  ASSESSMENT   Bryan Mueller is a 53 y.o. male with hx of DM2 that is poorly controlled who presents with confusion looking at the ceiling. He had an episode at 1500 yesterday and then at 2200 yesterda. Wife witnessed and his head was turned up and he was looking up in the ceiling. He was not responding to questions. No post ictal period. Patient has some recollection of this. EMS called and glucose was 475. He was iniitally brought to [facility] where CT Head was concerning for a small acute/subacute appearing L frontal stroke. He was transferred to [facility] for MRI Brain overnight which shows scattered L MCA  distribution strokes.  I supsect that he likely had focal seizures in the setting of noted cortical L MCA Strokes, small cortical petechial hemorrhage and hyperglycemia to 475.  Etiology of his strokes is unclear and pending full stroke workup at this time.  RECOMMENDATIONS  Scattered small volume L MCA Strokes: - Frequent Neuro checks per stroke unit protocol - Recommend brain imaging with MRI Brain without contrast - Recommend Vascular imaging with CTA head and neck - Recommend obtaining TTE - Recommend obtaining Lipid panel with LDL - Please start statin if LDL > 70 - Recommend HbA1c to evaluate for diabetes and how well it is controlled. - Antithrombotic - Aspirin 81mg  daily along with plavix 75mg  daily x 21days, followed by Aspirin 81mg  daily alone. - Recommend DVT ppx - SBP goal - aim for SBP less than 180 in the setting of confluent petechial hemorrhage. - Recommend Telemetry monitoring for arrythmia - Recommend bedside swallow screen prior to PO intake. - Stroke education booklet - Recommend PT/OT/SLP consult - counseled him on the importance of quitting chewing tobacco and marijuana to reduce risk of strokes in the future.  2 episodes of starring off concerning for focal seizures: - routine EEG. - recommend Keppra load of 1500mg  IV once, followed by 500mg  BID. - no driving for 6 months. Has to be seizure free before he can resume driving. Would recommend reiterating this since I woke him up from sleep when I evaluated him. Detailed seizure precautions listed below and also in the discharge instructions.  ______________________________________________________________________  I personally spent a total of 75 minutes in the care of the patient today including preparing to see the patient, getting/reviewing separately obtained history, performing a medically appropriate exam/evaluation,  counseling and educating, placing orders, referring and communicating with other health care  professionals, documenting clinical information in the EHR, independently interpreting results, communicating results, and coordinating care.   Signed, Tom Macpherson, MD [facility]    -------------------------------------------------------------------------------------------------- Seizure precautions: Per [place]  DMV statutes, patients with seizures are not allowed to drive until they have been seizure-free for six months and cleared by a physician    Use caution when using heavy equipment or power tools. Avoid working on ladders or at heights. Take showers instead of baths. Ensure the water temperature is not too high on the home water heater. Do not go swimming alone. Do not lock yourself in a room alone (i.e. bathroom). When caring for infants or small children, sit down when holding, feeding, or changing them to minimize risk of injury to the child in the event you have a seizure. Maintain good sleep hygiene. Avoid alcohol.    If patient has another seizure, call 911 and bring them back to the ED if: A.  The seizure lasts longer than 5 minutes.      B.  The patient doesn't wake shortly after the seizure or has new problems such as difficulty seeing, speaking or moving following the seizure C.  The patient was injured during the seizure D.  The patient has a temperature over 102 F (39C) E.  The patient vomited during the seizure and now is having trouble breathing    During the Seizure   - First, ensure adequate ventilation and place patients on the floor on their left side  Loosen clothing around the neck and ensure the airway is patent. If the patient is clenching the teeth, do not force the mouth open with any object as this can cause severe damage - Remove all items from the surrounding that can be hazardous. The patient may be oblivious to what's happening and may not even know what he or she is doing. If the patient is confused and wandering, either gently  guide him/her away and block access to outside areas - Reassure the individual and be comforting - Call 911. In most cases, the seizure ends before EMS arrives. However, there are cases when seizures may last over 3 to 5 minutes. Or the individual may have developed breathing difficulties or severe injuries. If a pregnant patient or a person with diabetes develops a seizure, it is prudent to call an ambulance. - Finally, if the patient does not regain full consciousness, then call EMS. Most patients will remain confused for about 45 to 90 minutes after a seizure, so you must use judgment in calling for help. - Avoid restraints but make sure the patient is in a bed with padded side rails - Place the individual in a lateral position with the neck slightly flexed; this will help the saliva drain from the mouth and prevent the tongue from falling backward - Remove all nearby furniture and other hazards from the area - Provide verbal assurance as the individual is regaining consciousness - Provide the patient with privacy if possible - Call for help and start treatment as ordered by the caregiver    After the Seizure (Postictal Stage)   After a seizure, most patients experience confusion, fatigue, muscle pain and/or a headache. Thus, one should permit the individual to sleep. For the next few days, reassurance is essential. Being calm and helping reorient the person is also of importance.   Most seizures are painless and end spontaneously. Seizures are not harmful to  others but can lead to complications such as stress on the lungs, brain and the heart. Individuals with prior lung problems may develop labored breathing and respiratory distress.

## 2024-03-19 NOTE — Progress Notes (Signed)
 Routine EEG complete. Results pending.

## 2024-03-19 NOTE — ED Notes (Signed)
Pt provided urinal for sample

## 2024-03-19 NOTE — ED Notes (Signed)
 Able to arrange transport [facility]

## 2024-03-19 NOTE — Inpatient Diabetes Management (Signed)
 Inpatient Diabetes Program Recommendations  AACE/ADA: New Consensus Statement on Inpatient Glycemic Control (2015)  Target Ranges:  Prepandial:   less than 140 mg/dL      Peak postprandial:   less than 180 mg/dL (1-2 hours)      Critically ill patients:  140 - 180 mg/dL   Lab Results  Component Value Date   GLUCAP 160 (H) 03/19/2024   HGBA1C 9.5 (H) 07/17/2023    Review of Glycemic Control  Latest Reference Range & Units 03/18/24 23:42 03/19/24 02:31 03/19/24 07:41  Glucose-Capillary 70 - 99 mg/dL 587 (H) 672 (H) 839 (H)   Diabetes history: DM 2 Outpatient Diabetes medications:  Glucotrol  5 mg bid, Metformin  1000 mg bid- not taking Current orders for Inpatient glycemic control:  Novolog  0-6 units q 4 hours  Inpatient Diabetes Program Recommendations:    Agree with current orders.  He will need to restart  home meds.  Of note both Metformin  and glucotrol  can be purchased for $4 each/month.  Will follow. A1C pending.   Thanks,  Randall Bullocks, RN, BC-ADM Inpatient Diabetes Coordinator Pager [phone]  (8a-5p)

## 2024-03-19 NOTE — ED Notes (Signed)
 Patient transported to MRI

## 2024-03-20 ENCOUNTER — Observation Stay (HOSPITAL_BASED_OUTPATIENT_CLINIC_OR_DEPARTMENT_OTHER): Payer: Self-pay

## 2024-03-20 DIAGNOSIS — I6523 Occlusion and stenosis of bilateral carotid arteries: Secondary | ICD-10-CM

## 2024-03-20 LAB — RAPID URINE DRUG SCREEN, HOSP PERFORMED
Amphetamines: NOT DETECTED
Barbiturates: NOT DETECTED
Benzodiazepines: NOT DETECTED
Cocaine: NOT DETECTED
Opiates: NOT DETECTED
Tetrahydrocannabinol: NOT DETECTED

## 2024-03-20 LAB — GLUCOSE, CAPILLARY
Glucose-Capillary: 119 mg/dL — ABNORMAL HIGH (ref 70–99)
Glucose-Capillary: 196 mg/dL — ABNORMAL HIGH (ref 70–99)
Glucose-Capillary: 128 mg/dL — ABNORMAL HIGH (ref 70–99)
Glucose-Capillary: 232 mg/dL — ABNORMAL HIGH (ref 70–99)
Glucose-Capillary: 224 mg/dL — ABNORMAL HIGH (ref 70–99)
Glucose-Capillary: 188 mg/dL — ABNORMAL HIGH (ref 70–99)

## 2024-03-20 MED ORDER — INSULIN GLARGINE-YFGN 100 UNIT/ML ~~LOC~~ SOPN
10.0000 [IU] | PEN_INJECTOR | SUBCUTANEOUS | Status: DC
Start: 2024-03-20 — End: 2024-03-20

## 2024-03-20 MED ORDER — INSULIN GLARGINE-YFGN 100 UNIT/ML ~~LOC~~ SOLN
10.0000 [IU] | Freq: Every day | SUBCUTANEOUS | Status: DC
  Administered 2024-03-20: 10 [IU] via SUBCUTANEOUS
  Filled 2024-03-20 (×2): qty 0.1

## 2024-03-20 NOTE — Progress Notes (Signed)
 STROKE TEAM PROGRESS NOTE   INTERIM HISTORY/SUBJECTIVE Wife and other family members are at the bedside. Pt lying in bed, neuro stable, no change. No acute event overnight. No more seizure activities. CTA reported left ICA 58% stenosis. Will order CUS and recommend VVS consult.   OBJECTIVE  CBC    Component Value Date/Time   WBC 10.3 03/19/2024 0010   RBC 4.34 03/19/2024 0010   HGB 13.7 03/19/2024 0010   HCT 38.5 (L) 03/19/2024 0010   PLT 233 03/19/2024 0010   MCV 88.7 03/19/2024 0010   MCH 31.6 03/19/2024 0010   MCHC 35.6 03/19/2024 0010   RDW 12.2 03/19/2024 0010   LYMPHSABS 1.5 03/19/2024 0010   MONOABS 0.9 03/19/2024 0010   EOSABS 0.1 03/19/2024 0010   BASOSABS 0.1 03/19/2024 0010    BMET    Component Value Date/Time   NA 134 (L) 03/19/2024 0010   K 4.2 03/19/2024 0010   CL 98 03/19/2024 0010   CO2 27 03/19/2024 0010   GLUCOSE 436 (H) 03/19/2024 0010   BUN 13 03/19/2024 0010   CREATININE 0.92 03/19/2024 0010   CALCIUM  8.5 (L) 03/19/2024 0010   GFRNONAA >60 03/19/2024 0010    IMAGING past 24 hours No results found.   Vitals:   03/19/24 2352 03/20/24 0500 03/20/24 0730 03/20/24 1151  BP: (!) 152/85 (!) 142/80 (!) 106/56 105/66  Pulse: 81 85 86 84  Resp:   18 18  Temp: 99 F (37.2 C) 99.2 F (37.3 C) 98.5 F (36.9 C)   TempSrc:  Oral    SpO2: 100% 99% 100% 100%  Weight:      Height:         PHYSICAL EXAM General:  Alert, well-nourished, well-developed patient in no acute distress Psych:  Mood and affect appropriate for situation CV: Regular rate and rhythm on monitor Respiratory:  Regular, unlabored respirations on room air GI: Abdomen soft and nontender   NEURO:  awake, alert, eyes open, orientated to age, place, time and people. No aphasia, fluent language, following all simple commands, with accent but no dysarthria per wife. Able to name and repeat. No gaze palsy, tracking bilaterally, visual field full. No facial droop. Tongue midline.  Bilateral UEs 5/5, no drift. Bilaterally LEs 5/5, no drift. Sensation symmetrical bilaterally, b/l FTN intact, gait not tested    ASSESSMENT/PLAN  [name] is a 53 y.o. male with history of DM2 that is poorly controlled who presents with confusion looking at the ceiling. He had an episode at 1500 yesterday and then at 2200 yesterda. Wife witnessed and his head was turned up and he was looking up in the ceiling. He was not responding to questions. No post ictal period. Patient has some recollection of this. EMS called and glucose was 475. He was iniitally brought to [facility] where CT Head was concerning for a small acute/subacute appearing L frontal stroke. He was transferred to [facility] for MRI Brain overnight which shows scattered L MCA distribution strokes. NIH on Admission 0  Stroke:  left MCA scattered small/punctate infarcts, etiology: likely from left ICA and MCA stenosis  Code Stroke CT head Age-indeterminate left frontal lobe infarct  CTA head & neck Positive for Bulky low-density plaque at the left carotid bifurcation and ICA origin causing approximately 58% stenosis. 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 Up to Moderate stenoses at the Left MCA origin, proximal left M1  segment, Right MCA trifurcation, and Right P2 MRI  Widely scattered small acute on nonacute left MCA territory infarcts  2D Echo EF 60-65% EEG - This study is within normal limits. No seizures or epileptiform discharges were seen throughout the recording.  May consider 30 day cardiac event monitoring as outpt based on stroke work up LDL 146, TG 343 HgbA1c 9.5 UDS pending VTE prophylaxis - SCDs No antithrombotic prior to admission, now on aspirin 81 mg daily and clopidogrel 75 mg daily   Therapy recommendations:  outpt PT Disposition:  pending  Seizures 2 Episodes with eyes rolling back, head  turning and shaking jerking, concerning for seizure EEG no seizure Keppra load of 1500mg  IV once, followed by 500mg  BID No driving for 6 months. Has to be seizure free for 6 months and under physician's care before he can resume driving Detailed seizure precautions listed below and also in the discharge instructions.  Carotid stenosis CTA neck showed Bulky low-density plaque at the left carotid bifurcation and ICA origin causing approximately 58% stenosis. Likely symptomatic at this time Carotid Doppler pending Will consider vascular surgery consultation  Diabetes type II Uncontrolled Home meds:  Insulin , metformin   HgbA1c 9.5, goal < 7.0 Hyperglycemia on admission CBGs SSI Recommend close follow-up with PCP for better DM control  Hyperlipidemia Home meds none LDL 146, TG 343 On Lipitor 80 Continue statin on discharge  Tobacco Abuse Patient chews tobacco       Ready to quit? Yes Nicotine replacement therapy provided  Substance Abuse Chronic THC user UDS pending Cessation education will be provided  Other stroke risk factors   Other acute medical issue Elevated ammonia at 52, management per primary team  Hospital day # 0   Ary Cummins, MD PhD Stroke Neurology 03/20/2024 12:29 PM   Per [place]  DMV statutes, patients with seizures are not allowed to drive until they have been seizure-free for six months Use caution when using heavy equipment or power tools. Avoid working on ladders or at heights. Take showers instead of baths. Ensure the water temperature is not too high on the home water heater. Do not go swimming alone. Do not lock yourself in a room alone (i.e. bathroom). When caring for infants or small children, sit down when holding, feeding, or changing them to minimize risk of injury to the child in the event you have a seizure. Maintain good sleep hygiene. Avoid alcohol. After a seizure, most patients experience confusion, fatigue, muscle pain and/or a  headache. Thus, one should permit the individual to sleep. For the next few days, reassurance is essential. Being calm and helping reorient the person is also of importance.   Most seizures are painless and end spontaneously. Seizures are not harmful to others but can lead to complications such as stress on the lungs, brain and the heart. Individuals with prior lung problems may develop labored breathing and respiratory distress.      To contact Stroke Continuity provider, please refer to [email or web address]. After hours, contact General Neurology

## 2024-03-20 NOTE — Evaluation (Signed)
 Physical Therapy Evaluation Patient Details Name: Bryan Mueller MRN: [id number] DOB: [birth year] Today's Date: 03/20/2024  History of Present Illness  53 y.o. male presented via EMS 11/11 after episode of dizziness and confusion. CBG 475 Experienced similar episode earlier in the day at work. 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 MRI brain WO showed widely scattered small acute on nonacute left MCA territory infarcts Non compliant with DM medication due to inability to afford. PMH: DM2  Clinical Impression  PTA pt living with wife in single story home with 2 steps to enter. 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 Pt is currently limited in safe mobility by deficits in balance and cognition. Pt is independent with bed mobility, supervision for transfers and contact guard to supervision for ambulation and steps without AD. Performed DGI and pt with 20/24 indicating dynamic balance deficits. PT recommending OP Neuro PT. PT will continue to follow acutely and refer to Mobility Specialist.        If plan is discharge home, recommend the following: Assist for transportation   Can travel by private vehicle    yes    Equipment Recommendations None recommended by PT  Recommendations for Other Services  OT consult (have asked OT to look at cognition)    Functional Status Assessment Patient has had a recent decline in their functional status and demonstrates the ability to make significant improvements in function in a reasonable and predictable amount of time.     Precautions / Restrictions Precautions Precautions: Fall Restrictions Weight Bearing Restrictions Per Provider Order: No      Mobility  Bed Mobility Overal bed mobility: Independent             General bed mobility comments: from flattened bed    Transfers Overall transfer level: Modified independent                 General transfer comment: good power up, increased time to self  steady    Ambulation/Gait Ambulation/Gait assistance: Contact guard assist, Supervision Gait Distance (Feet): 450 Feet Assistive device: None Gait Pattern/deviations: Step-through pattern, Drifts right/left Gait velocity: WFL Gait velocity interpretation: >4.37 ft/sec, indicative of normal walking speed   General Gait Details: pt is contact guard progressing to supervision, with mildly unsteady gait, difficult to assess due to wearing clogs and sliding his feet along which he reports is his normal.  Stairs Stairs: Yes Stairs assistance: Contact guard assist Stair Management: No rails, Alternating pattern, Forwards, Backwards Number of Stairs: 1 (x2) General stair comments: difficulty with sequencing stepping up and back step, with visual and verbal example        Balance Overall balance assessment: Needs assistance                               Standardized Balance Assessment Standardized Balance Assessment : Dynamic Gait Index   Dynamic Gait Index Level Surface: Normal Change in Gait Speed: Mild Impairment Gait with Horizontal Head Turns: Mild Impairment Gait with Vertical Head Turns: Normal Gait and Pivot Turn: Mild Impairment Step Over Obstacle: Normal Step Around Obstacles: Mild Impairment Steps: Normal Total Score: 20       Pertinent Vitals/Pain Pain Assessment Pain Assessment: No/denies pain    Home Living Family/patient expects to be discharged to:: Private residence Living Arrangements: Spouse/significant other Available Help at Discharge: Family Type of Home: House Home Access: Stairs to enter Entrance Stairs-Rails: Can reach both Entrance Stairs-Number of  Steps: 2   Home Layout: One level Home Equipment: None      Prior Function Prior Level of Function : Independent/Modified Independent;Working/employed                     Extremity/Trunk Assessment   Upper Extremity Assessment Upper Extremity Assessment: Defer to OT  evaluation    Lower Extremity Assessment Lower Extremity Assessment: Overall WFL for tasks assessed    Cervical / Trunk Assessment Cervical / Trunk Assessment: Normal  Communication   Communication Communication: No apparent difficulties    Cognition Arousal: Alert Behavior During Therapy: Flat affect   PT - Cognitive impairments: Sequencing, Problem solving, Initiation                       PT - Cognition Comments: Pt with difficulty with seqencing stepping up and down stairs, and with wayfinding back to his room Following commands: Intact       Cueing Cueing Techniques: Verbal cues, Tactile cues, Visual cues     General Comments General comments (skin integrity, edema, etc.): wife in room, VSS on RA, educated on BE FAST stroke symptomology     Assessment/Plan    PT Assessment Patient needs continued PT services  PT Problem List Decreased balance;Decreased coordination;Decreased cognition       PT Treatment Interventions Gait training;Stair training;Functional mobility training;Therapeutic activities;Balance training;Therapeutic exercise;Cognitive remediation;Patient/family education    PT Goals (Current goals can be found in the Care Plan section)  Acute Rehab PT Goals Patient Stated Goal: go back to work PT Goal Formulation: With patient/family Time For Goal Achievement: 04/03/24 Potential to Achieve Goals: Good    Frequency Min 3X/week        AM-PAC PT 6 Clicks Mobility  Outcome Measure Help needed turning from your back to your side while in a flat bed without using bedrails?: None Help needed moving from lying on your back to sitting on the side of a flat bed without using bedrails?: None Help needed moving to and from a bed to a chair (including a wheelchair)?: None Help needed standing up from a chair using your arms (e.g., wheelchair or bedside chair)?: None Help needed to walk in hospital room?: None Help needed climbing 3-5 steps with  a railing? : A Little 6 Click Score: 23    End of Session Equipment Utilized During Treatment: Gait belt Activity Tolerance: Patient tolerated treatment well Patient left: in bed;with call bell/phone within reach;with family/visitor present Nurse Communication: Mobility status PT Visit Diagnosis: Unsteadiness on feet (R26.81);Difficulty in walking, not elsewhere classified (R26.2)    Time: 9078-9055 PT Time Calculation (min) (ACUTE ONLY): 23 min   Charges:   PT Evaluation $PT Eval Moderate Complexity: 1 Mod PT Treatments $Gait Training: 8-22 mins PT General Charges $$ ACUTE PT VISIT: 1 Visit         Ottis Sarnowski B. Fleeta Lapidus PT, DPT Acute Rehabilitation Services Please use secure chat or  Call Office [phone]   Almarie KATHEE Fleeta Fleet 03/20/2024, 10:00 AM

## 2024-03-20 NOTE — Hospital Course (Signed)
 53 y.o. male with medical history significant of DM2 p/w acute stroke.   The patient experienced an altered mental status episode at work, characterized by eye rolling and falling down. Following the event, the patient went home with assistance. At home, another episode occurred at 1500 and 2200, involving shaking, laying down, and staring at the ceiling. An attempt was made to lay the patient down, but an ambulance was called, and the patient was brought to the hospital.    In the ED, pt hypertensive. Labs notable for glucose 436 (beta-hydroxybuterate 0.10 wnl), and ammonia 52. 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 MRI brain WO showed widely scattered small acute on nonacute left MCA territory infarcts (largest 8 mm). EDP consulted Neurology

## 2024-03-20 NOTE — Evaluation (Signed)
 Occupational Therapy Evaluation Patient Details Name: Bryan Mueller MRN: [id number] DOB: 08-14-1970 Today's Date: 03/20/2024   History of Present Illness   53 y.o. male presented via EMS 11/11 after episode of dizziness and confusion. CBG 475 Experienced similar episode earlier in the day at work. 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 MRI brain WO showed widely scattered small acute on nonacute left MCA territory infarcts Non compliant with DM medication due to inability to afford. PMH: DM2     Clinical Impressions OT greeted pt in bed, supportive wife at bedside. AOX4. PTA, pt was working full time, driving, and fully independent. Lives with his wife and has 2 adult children who live nearby. He presents today with mild balance deficits and impaired memory recall/attention. He scored 12/28 on Short Blessed Test, indicating mild cognitive impairments. Majority of cog deficits related to recall, attention, and comprehension. Discussed functional impacts of these deficits on his ADLs and resumption of work. Educated on memory compensatory strategies. Functionally, he is near his baseline - mod I for all mobility and ADL simulation.  Given pt is near his baseline, will sign-off. Recommend OPOT with neuro rehab focus upon discharge.     If plan is discharge home, recommend the following:   Assistance with cooking/housework;Direct supervision/assist for medications management;Assist for transportation     Functional Status Assessment         Equipment Recommendations   None recommended by OT     Recommendations for Other Services         Precautions/Restrictions   Precautions Precautions: Fall Restrictions Weight Bearing Restrictions Per Provider Order: No     Mobility Bed Mobility Overal bed mobility: Independent                  Transfers Overall transfer level: Modified independent                 General transfer comment: no AD for  ambulation community distance      Balance Overall balance assessment: Mild deficits observed, not formally tested (no LOB during shopping simulation (involving various head turns and change in direction) in hallway)                                         ADL either performed or assessed with clinical judgement   ADL Overall ADL's : Modified independent                                       General ADL Comments: pt performed UB/LB ADL simulation without difficulty at mod I level (sitting & standing)     Vision Baseline Vision/History: 0 No visual deficits (no eye glasses present during OT session) Ability to See in Adequate Light: 0 Adequate Patient Visual Report: No change from baseline Vision Assessment?: No apparent visual deficits     Perception         Praxis         Pertinent Vitals/Pain Pain Assessment Pain Assessment: No/denies pain     Extremity/Trunk Assessment Upper Extremity Assessment Upper Extremity Assessment: Right hand dominant (mild weakness in LUE (grossly 4-/5))   Lower Extremity Assessment Lower Extremity Assessment: Defer to PT evaluation   Cervical / Trunk Assessment Cervical / Trunk Assessment: Normal   Communication Communication Communication: No apparent difficulties  Cognition Arousal: Alert Behavior During Therapy: Flat affect (participatory)               OT - Cognition Comments: pt scored 12/28 on Short Blessed Test                 Following commands: Intact       Cueing  General Comments   Cueing Techniques: Verbal cues;Visual cues  supportive wife in room   Exercises     Shoulder Instructions      Home Living Family/patient expects to be discharged to:: Private residence Living Arrangements: Spouse/significant other Available Help at Discharge: Family Type of Home: House Home Access: Stairs to enter Secretary/administrator of Steps: 2 Entrance Stairs-Rails: Can  reach both Home Layout: One level     Bathroom Shower/Tub: Chief Strategy Officer: Standard Bathroom Accessibility: Yes   Home Equipment: None          Prior Functioning/Environment Prior Level of Function : Independent/Modified Independent;Working/employed;Driving             Mobility Comments: no AD ADLs Comments: works full time as a Investment Banker, 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[place], + driving    OT Problem List: Decreased strength;Impaired balance (sitting and/or standing);Decreased cognition   OT Treatment/Interventions:        OT Goals(Current goals can be found in the care plan section)   Acute Rehab OT Goals Patient Stated Goal: go home   OT Frequency:       Co-evaluation              AM-PAC OT 6 Clicks Daily Activity     Outcome Measure Help from another person eating meals?: None Help from another person taking care of personal grooming?: None Help from another person toileting, which includes using toliet, bedpan, or urinal?: None Help from another person bathing (including washing, rinsing, drying)?: None Help from another person to put on and taking off regular upper body clothing?: None Help from another person to put on and taking off regular lower body clothing?: None 6 Click Score: 24   End of Session    Activity Tolerance: Patient tolerated treatment well Patient left: in bed;with call bell/phone within reach;with family/visitor present  OT Visit Diagnosis: Unsteadiness on feet (R26.81);Cognitive communication deficit (R41.841);Other abnormalities of gait and mobility (R26.89) Symptoms and signs involving cognitive functions: Cerebral infarction                Time: 8481-8464 OT Time Calculation (min): 17 min Charges:  OT General Charges $OT Visit: 1 Visit OT Evaluation $OT Eval Low Complexity: 1 Low  Lashauna Arpin M. [place], OTR/L [facility] Acute Rehabilitation Services [phone] Secure Chat Preferred  Lashona Schaaf 03/20/2024,  4:32 PM

## 2024-03-20 NOTE — Inpatient Diabetes Management (Addendum)
 Inpatient Diabetes Program Recommendations  AACE/ADA: New Consensus Statement on Inpatient Glycemic Control   Target Ranges:  Prepandial:   less than 140 mg/dL      Peak postprandial:   less than 180 mg/dL (1-2 hours)      Critically ill patients:  140 - 180 mg/dL   Lab Results  Component Value Date   GLUCAP 128 (H) 03/20/2024   HGBA1C 10.3 (H) 03/19/2024    Latest Reference Range & Units 03/19/24 17:03 03/19/24 20:04 03/20/24 00:19 03/20/24 04:57 03/20/24 07:30  Glucose-Capillary 70 - 99 mg/dL 783 (H) 813 (H) 803 (H) 119 (H) 128 (H)   Review of Glycemic Control  Diabetes history: DM 2  Outpatient Diabetes medications:  Glucotrol  5 mg bid, Metformin  1000 mg bid- not taking  Current orders for Inpatient glycemic control:  Novolog  0-6 units q 4 hours   Inpatient Diabetes Program Recommendations:   Blood glucose improved. Recommend restarting Metformin  and Glucotrol  while inpatient to monitor CBGs.   Both Metformin  and glucotrol  can be purchased for $4 each/month.   Spoke with patient and patient's wife at bedside. Patient states he stopped taking his medications for diabetes months ago. Patient states he does not have insurance and can't afford them - I made patient aware metformin  and glucotrol  should $4 each /month.    Patient reports not being followed by PCP for diabetes management - per TOC SW note, Clinics that see the uninsured added to AVS. Patient reports he does not check his glucose at home. Discussed A1C results 10.3% and explained that current A1C indicates an average glucose of 249 mg/dl over the past 2-3 months. Discussed glucose and A1C goals. Discussed importance of checking CBGs and maintaining good CBG control to prevent long-term and short-term complications. Explained how hyperglycemia leads to damage within blood vessels which lead to the common complications seen with uncontrolled diabetes. Discussed impact of nutrition, exercise, stress, sickness, and  medications on diabetes control. Patient states he is active and works as a Investment Banker, Operational at [employer]. Discussed carbohydrates, carbohydrate goals per day and meal, along with portion sizes. Encouraged patient to check glucose 4 times per day (before meals and at bedtime). Patient verbalized understanding of information discussed and reports no further questions at this time related to diabetes.   Thanks,  Lavanda Search, RN, MSN, [facility]  Inpatient Diabetes Coordinator  Pager [phone] (8a-5p)

## 2024-03-20 NOTE — Progress Notes (Signed)
  Progress Note   Patient: Bryan Mueller FMW:[id number] DOB: 08/23/1970 DOA: 03/18/2024     0 DOS: the patient was seen and examined on 03/20/2024   Brief hospital course: 53 y.o. male with medical history significant of DM2 p/w acute stroke.   The patient experienced an altered mental status episode at work, characterized by eye rolling and falling down. Following the event, the patient went home with assistance. At home, another episode occurred at 1500 and 2200, involving shaking, laying down, and staring at the ceiling. An attempt was made to lay the patient down, but an ambulance was called, and the patient was brought to the hospital.    In the ED, pt hypertensive. Labs notable for glucose 436 (beta-hydroxybuterate 0.10 wnl), and ammonia 52. 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 MRI brain WO showed widely scattered small acute on nonacute left MCA territory infarcts (largest 8 mm). EDP consulted Neurology  Assessment and Plan: Acute stroke -Neuro following; appreciate eval/recs -CTA is notable for 58% stenosis involving L carotid bifurcation and ICA -Vascular Surgery consulted, consideration for TCAR or carotid endarterectomy noted  -consider 30 day event monitor -continued on ASA and plavix -Plan statin on d/c   Staring spell c/f focal seizure -Neurology consulted -cont keppra 500mg  bid -Pt not to drive x 6 months and has to be seizure free for 6 months and under physician's care before driving again   DM2 -Cont SSI -glucose in the 200-range -will add semglee 10 u   Subjective: Reports feeling stronger today  Physical Exam: Vitals:   03/19/24 2352 03/20/24 0500 03/20/24 0730 03/20/24 1151  BP: (!) 152/85 (!) 142/80 (!) 106/56 105/66  Pulse: 81 85 86 84  Resp:   18 18  Temp: 99 F (37.2 C) 99.2 F (37.3 C) 98.5 F (36.9 C)   TempSrc:  Oral    SpO2: 100% 99% 100% 100%  Weight:      Height:       General exam: Awake, laying in bed, in nad Respiratory  system: Normal respiratory effort, no wheezing Cardiovascular system: regular rate, s1, s2 Gastrointestinal system: Soft, nondistended, positive BS Central nervous system: CN2-12 grossly intact, B strength intact Extremities: Perfused, no clubbing Skin: Normal skin turgor, no notable skin lesions seen Psychiatry: Mood normal // no visual hallucinations   Data Reviewed:  There are no new results to review at this time.  Family Communication: Pt in room, family at bedside  Disposition: Status is: Observation The patient will require care spanning > 2 midnights and should be moved to inpatient because: severity of illness  Planned Discharge Destination: Home     Author: Garnette Pelt, MD 03/20/2024 4:57 PM  For on call review [email or web address].

## 2024-03-20 NOTE — Progress Notes (Signed)
 Carotid artery duplex has been completed. Preliminary results can be found in CV Proc through chart review.   03/20/24 4:11 PM Cathlyn Collet RVT

## 2024-03-20 NOTE — TOC Progression Note (Signed)
 Transition of Care [facility]) - Progression Note    Patient Details  Name: Bryan Mueller MRN: [id number] Date of Birth: 12-26-1970  [facility] CM/SW Contact  Rosaline JONELLE Joe, RN Phone Number: 03/20/2024, 4:02 PM  Clinical Narrative:    CM spoke with the patient and wife at the bedside to discuss IP Care management needs.  The patient lives at home with spouse and plans to return home when stable.  Patient works as investment banker, operational.  Patient has no insurance provider at this time.  Secure email was sent to financial counselor to screen patient for Medicaid and assist with Medicaid application.  Patient has no PCP.  Patient was scheduled for [facility] - appointment noted in AVS for follow up next week.  OP Referral placed for outpatiet PT - to be co-signed by MD.                     Expected Discharge Plan and Services                                               Social Drivers of Health (SDOH) Interventions SDOH Screenings   Food Insecurity: No Food Insecurity (03/19/2024)  Housing: Low Risk  (03/19/2024)  Transportation Needs: No Transportation Needs (03/19/2024)  Utilities: Not At Risk (03/19/2024)  Tobacco Use: High Risk (03/19/2024)    Readmission Risk Interventions    12/16/2021   10:46 AM  Readmission Risk Prevention Plan  Post Dischage Appt Complete  Medication Screening Complete  Transportation Screening Complete

## 2024-03-20 NOTE — Consult Note (Addendum)
 Hospital Consult    Reason for Consult:  symptomatic carotid stenosis Requesting Physician:  Dr.Chiu MRN #:  [id number]  History of Present Illness: Nai Godeaux is a 53 y.o. male with a PMH of poorly controlled DMII who presented to the ED with confusion and strokelike symptoms 2 days ago.  While the patient was at work, he had an altered mental status episode with eye rolling and falling down.  Later that evening the patient had another episode of confusion, shaking, and staring at the ceiling.  EMS was called and he was also found to be hyperglycemic with a blood sugar of 475.  He first presented to [facility], ED and CT head was positive for small acute on subacute left MCA infarcts.  He was then transferred to [facility] for MRI brain which was positive for scattered acute left MCA infarcts.   Neurology has evaluated the patient and also obtained a CTA head/neck.  This has demonstrated about 50% stenosis of the left carotid bifurcation and ICA origin.  It was felt that the patient likely had focal seizures in the setting of acute left MCA strokes.  We have been consulted for evaluation of symptomatic left carotid stenosis.  The patient is resting comfortably on my exam.  He is back to his neurologic baseline.  He denies any repeat events of confusion or shaking since his hospital admission.  He denies any prior history of CVA or TIA.  He endorses occasional use of marijuana and chewing tobacco.  Past Medical History:  Diagnosis Date   Cellulitis    Diabetes mellitus, type 2 (HCC)     History reviewed. No pertinent surgical history.  No Known Allergies  Prior to Admission medications   Medication Sig Start Date End Date Taking? Authorizing Provider  blood glucose meter kit and supplies Dispense based on patient and insurance preference. Use up to four times daily as directed. (FOR ICD-10 E10.9, E11.9). Patient not taking: Reported on 07/17/2023 12/17/21   Raenelle Coria, MD   glipiZIDE  (GLUCOTROL ) 5 MG tablet Take 1 tablet (5 mg total) by mouth 2 (two) times daily before a meal. Patient not taking: Reported on 03/19/2024 07/17/23   Arlon Carliss ORN, DO  Insulin  Pen Needle 32G X 4 MM MISC Use as directed Patient not taking: Reported on 07/17/2023 12/17/21   Raenelle Coria, MD  metFORMIN  (GLUCOPHAGE ) 1000 MG tablet Take 1 tablet (1,000 mg total) by mouth 2 (two) times daily with a meal. Patient not taking: Reported on 03/19/2024 07/17/23 08/16/23  Arlon Carliss ORN, DO    Social History   Socioeconomic History   Marital status: Married    Spouse name: Not on file   Number of children: Not on file   Years of education: Not on file   Highest education level: Not on file  Occupational History   Not on file  Tobacco Use   Smoking status: Never   Smokeless tobacco: Current    Types: Chew  Vaping Use   Vaping status: Never Used  Substance and Sexual Activity   Alcohol use: Not Currently    Comment: Drinks Kava poweder per family its a beverage from their contry   Drug use: No   Sexual activity: Yes  Other Topics Concern   Not on file  Social History Narrative   Not on file   Social Drivers of Health   Financial Resource Strain: Not on file  Food Insecurity: No Food Insecurity (03/19/2024)   Hunger Vital Sign  Worried About Programme Researcher, Broadcasting/film/video in the Last Year: Never true    Ran Out of Food in the Last Year: Never true  Transportation Needs: No Transportation Needs (03/19/2024)   PRAPARE - Administrator, Civil Service (Medical): No    Lack of Transportation (Non-Medical): No  Physical Activity: Not on file  Stress: Not on file  Social Connections: Not on file  Intimate Partner Violence: Not At Risk (03/19/2024)   Humiliation, Afraid, Rape, and Kick questionnaire    Fear of Current or Ex-Partner: No    Emotionally Abused: No    Physically Abused: No    Sexually Abused: No    History reviewed. No pertinent family history.  ROS:  Otherwise negative unless mentioned in HPI  Physical Examination  Vitals:   03/20/24 0500 03/20/24 0730  BP: (!) 142/80 (!) 106/56  Pulse: 85 86  Resp:  18  Temp: 99.2 F (37.3 C) 98.5 F (36.9 C)  SpO2: 99% 100%   Body mass index is 25.82 kg/m.  General:  WDWN in NAD Gait: Not observed HENT: WNL, normocephalic Pulmonary: normal non-labored breathing Cardiac: regular Abdomen:  soft, NT/ND, no masses Skin: without rashes Vascular Exam/Pulses: palpable radial pulses bilaterally Extremities: without ischemic changes, without Gangrene , without cellulitis; without open wounds;  Musculoskeletal: no muscle wasting or atrophy  Neurologic: A&O X 3;  No focal weakness or paresthesias are detected; speech is fluent/normal Psychiatric:  The pt has normal affect. Lymph:  Unremarkable  CBC    Component Value Date/Time   WBC 10.3 03/19/2024 0010   RBC 4.34 03/19/2024 0010   HGB 13.7 03/19/2024 0010   HCT 38.5 (L) 03/19/2024 0010   PLT 233 03/19/2024 0010   MCV 88.7 03/19/2024 0010   MCH 31.6 03/19/2024 0010   MCHC 35.6 03/19/2024 0010   RDW 12.2 03/19/2024 0010   LYMPHSABS 1.5 03/19/2024 0010   MONOABS 0.9 03/19/2024 0010   EOSABS 0.1 03/19/2024 0010   BASOSABS 0.1 03/19/2024 0010    BMET    Component Value Date/Time   NA 134 (L) 03/19/2024 0010   K 4.2 03/19/2024 0010   CL 98 03/19/2024 0010   CO2 27 03/19/2024 0010   GLUCOSE 436 (H) 03/19/2024 0010   BUN 13 03/19/2024 0010   CREATININE 0.92 03/19/2024 0010   CALCIUM  8.5 (L) 03/19/2024 0010   GFRNONAA >60 03/19/2024 0010   GFRAA >60 06/02/2018 2131    COAGS: Lab Results  Component Value Date   INR 0.91 09/07/2014     Non-Invasive Vascular Imaging:   CTA Head/Neck (03/19/2024) 1. Advanced intracranial atherosclerosis (see #4) with no emergent large vessel occlusion. 2. Positive for Bulky low-density plaque at the left carotid bifurcation and ICA origin causing approximately 58% stenosis. 3. 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 4. 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 Up to Moderate stenoses at the Left MCA origin, proximal left M1 segment, Right MCA trifurcation, and Right PCA P2 segment.  Carotid Duplex Pending   Statin:  Yes.   Beta Blocker:  No. Aspirin:  Yes.   ACEI:  No. ARB:  No. CCB use:  No Other antiplatelets/anticoagulants:  Yes.    Plavix   ASSESSMENT/PLAN: This is a 53 y.o. male admitted with confusion and seizure-like symptoms   - The patient presented to the ED 2 days ago with two episodes of confusion, eye rolling, and shaking.  Initial head CT demonstrated likely  small acute left MCA infarcts.  He was then transferred to [facility] for MRI brain which confirmed several small acute on subacute left MCA infarcts. - The patient returned back to his neurologic baseline shortly after presentation to the emergency room.  Neurology has evaluated the patient and believes that he had focal seizures in the setting of his stroke.  Stroke workup included CTA head/neck, which demonstrated approximately 58% stenosis at the left carotid bifurcation and ICA origin.  This was felt to be the source of his stroke. - We were consulted for further evaluation and possible treatment of symptomatic carotid artery stenosis.  Upon exam today the patient is resting comfortably.  He is back to his neurologic baseline.  He denies any prior history of stroke or TIA.  He has palpable radial pulses bilaterally.  - I have discussed with the patient that he does have evidence of greater than 50% stenosis of the left carotid artery, which is likely the cause of his left MCA strokes.  I have also explained to the patient that he would benefit from carotid revascularization on the left to significantly reduce his risk of future stroke.  Based on imaging he could be a candidate for TCAR or carotid endarterectomy. He is agreeable to  future surgery - He will be getting a carotid duplex at the bedside today. Further surgical planning per Dr. Serene.  For now the patient should continue his aspirin, Plavix, and statin.    Ahmed Holster PA-C [facility] [phone]   I agree with the above.  I have seen and evaluated the patient.  Briefly this is a 53 year old gentleman who presented to the emergency department 2 days ago with confusion, eye rolling and shaking.  Imaging has revealed a left MCA infarct.  He has undergone a workup that included a CT angiogram revealing about a 60% stenosis of the left carotid artery which is felt to be the source of his embolic stroke.  He is now back to baseline.  His CT scan does show advanced intracranial atherosclerosis.  His brain MRI shows petechial hemorrhage with no mass effect.  I have discussed with the patient and his family that for further stroke prevention recommended proceeding with left carotid endarterectomy.  Because of this petechial hemorrhage, I would like to wait several days to give his brain a chance to heal.  We are considering left carotid endarterectomy next week.  I spoken to my partner Dr. Pearline who will be able to to perform his procedure.  Dr. Pearline will discuss this again with him tomorrow.  He potentially could be discharged home and come back for surgery.  Please continue aspirin, Plavix and high-dose statin  Malvina Serene

## 2024-03-21 ENCOUNTER — Other Ambulatory Visit: Payer: Self-pay | Admitting: Neurology

## 2024-03-21 ENCOUNTER — Other Ambulatory Visit (HOSPITAL_COMMUNITY): Payer: Self-pay

## 2024-03-21 DIAGNOSIS — I63542 Cerebral infarction due to unspecified occlusion or stenosis of left cerebellar artery: Secondary | ICD-10-CM

## 2024-03-21 DIAGNOSIS — I639 Cerebral infarction, unspecified: Secondary | ICD-10-CM

## 2024-03-21 DIAGNOSIS — E119 Type 2 diabetes mellitus without complications: Secondary | ICD-10-CM

## 2024-03-21 DIAGNOSIS — E785 Hyperlipidemia, unspecified: Secondary | ICD-10-CM

## 2024-03-21 DIAGNOSIS — I63512 Cerebral infarction due to unspecified occlusion or stenosis of left middle cerebral artery: Secondary | ICD-10-CM

## 2024-03-21 LAB — COMPREHENSIVE METABOLIC PANEL WITH GFR
ALT: 16 U/L (ref 0–44)
AST: 22 U/L (ref 15–41)
Albumin: 2.9 g/dL — ABNORMAL LOW (ref 3.5–5.0)
Alkaline Phosphatase: 116 U/L (ref 38–126)
Anion gap: 11 (ref 5–15)
BUN: 14 mg/dL (ref 6–20)
CO2: 27 mmol/L (ref 22–32)
Calcium: 9.2 mg/dL (ref 8.9–10.3)
Chloride: 98 mmol/L (ref 98–111)
Creatinine, Ser: 0.82 mg/dL (ref 0.61–1.24)
GFR, Estimated: >60 mL/min (ref >60)
Glucose, Bld: 135 mg/dL — ABNORMAL HIGH (ref 70–99)
Potassium: 4.1 mmol/L (ref 3.5–5.1)
Sodium: 136 mmol/L (ref 135–145)
Total Bilirubin: 0.9 mg/dL (ref 0.0–1.2)
Total Protein: 6.2 g/dL — ABNORMAL LOW (ref 6.5–8.1)

## 2024-03-21 LAB — GLUCOSE, CAPILLARY
Glucose-Capillary: 154 mg/dL — ABNORMAL HIGH (ref 70–99)
Glucose-Capillary: 156 mg/dL — ABNORMAL HIGH (ref 70–99)
Glucose-Capillary: 165 mg/dL — ABNORMAL HIGH (ref 70–99)
Glucose-Capillary: 205 mg/dL — ABNORMAL HIGH (ref 70–99)

## 2024-03-21 LAB — CBC
HCT: 41.2 % (ref 39.0–52.0)
Hemoglobin: 14.7 g/dL (ref 13.0–17.0)
MCH: 31.2 pg (ref 26.0–34.0)
MCHC: 35.7 g/dL (ref 30.0–36.0)
MCV: 87.5 fL (ref 80.0–100.0)
Platelets: 236 K/uL (ref 150–400)
RBC: 4.71 MIL/uL (ref 4.22–5.81)
RDW: 12.2 % (ref 11.5–15.5)
WBC: 10.8 K/uL — ABNORMAL HIGH (ref 4.0–10.5)
nRBC: 0 % (ref 0.0–0.2)

## 2024-03-21 MED ORDER — ASPIRIN 81 MG PO TBEC
81.0000 mg | DELAYED_RELEASE_TABLET | Freq: Every day | ORAL | 0 refills | Status: AC
  Filled 2024-03-21: qty 30, 30d supply, fill #0

## 2024-03-21 MED ORDER — METFORMIN HCL 1000 MG PO TABS
1000.0000 mg | ORAL_TABLET | Freq: Two times a day (BID) | ORAL | 0 refills | Status: DC
  Filled 2024-03-21: qty 60, 30d supply, fill #0

## 2024-03-21 MED ORDER — ATORVASTATIN CALCIUM 80 MG PO TABS
80.0000 mg | ORAL_TABLET | Freq: Every day | ORAL | 0 refills | Status: AC
  Filled 2024-03-21: qty 30, 30d supply, fill #0

## 2024-03-21 MED ORDER — CLOPIDOGREL BISULFATE 75 MG PO TABS
75.0000 mg | ORAL_TABLET | Freq: Every day | ORAL | 0 refills | Status: AC
  Filled 2024-03-21: qty 30, 30d supply, fill #0

## 2024-03-21 MED ORDER — LEVETIRACETAM 500 MG PO TABS
500.0000 mg | ORAL_TABLET | Freq: Two times a day (BID) | ORAL | 0 refills | Status: AC
  Filled 2024-03-21: qty 60, 30d supply, fill #0

## 2024-03-21 MED ORDER — GLIPIZIDE 5 MG PO TABS
5.0000 mg | ORAL_TABLET | Freq: Two times a day (BID) | ORAL | 0 refills | Status: DC
  Filled 2024-03-21: qty 60, 30d supply, fill #0

## 2024-03-21 NOTE — Plan of Care (Signed)

## 2024-03-21 NOTE — Discharge Summary (Signed)
 Physician Discharge Summary   Patient: Bryan Mueller MRN: [id number] DOB: 11/26/1970  Admit date:     03/18/2024  Discharge date: 03/21/24  Discharge Physician: Garnette Pelt   PCP: Patient, No Pcp Per   Recommendations at discharge:    Follow up with PCP In 1-2 weeks Follow up with Vascular Surgery as scheduled  Discharge Diagnoses: Principal Problem:   Acute ischemic stroke Bryan Mueller)  Resolved Problems:   * No resolved hospital problems. *  Hospital Course: 53 y.o. male with medical history significant of DM2 p/w acute stroke.   The patient experienced an altered mental status episode at work, characterized by eye rolling and falling down. Following the event, the patient went home with assistance. At home, another episode occurred at 1500 and 2200, involving shaking, laying down, and staring at the ceiling. An attempt was made to lay the patient down, but an ambulance was called, and the patient was brought to the hospital.    In the ED, pt hypertensive. Labs notable for glucose 436 (beta-hydroxybuterate 0.10 wnl), and ammonia 52. 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 MRI brain WO showed widely scattered small acute on nonacute left MCA territory infarcts (largest 8 mm). EDP consulted Neurology  Assessment and Plan: Acute stroke -Neuro had been following -CTA is notable for 58% stenosis involving L carotid bifurcation and ICA -Vascular Surgery consulted, consideration for TCAR or carotid endarterectomy noted  -continued on ASA and plavix. Per Neurology, timing of anti-platelet deferred to Vascular Surgery -Discharge on statin   Staring spell c/f focal seizure -Neurology consulted -cont keppra 500mg  bid -Pt not to drive x 6 months and has to be seizure free for 6 months and under physician's care before driving again   DM2 -Cont SSI while in hospital -glucose in the 200-range -added semglee 10 u -Per Diabetic Coordinator, recommend to continue home meds on  d/c       Consultants: Neurology, Stroke MD Procedures performed:   Disposition: Home Diet recommendation:  Cardiac and Carb modified diet DISCHARGE MEDICATION: Allergies as of 03/21/2024   No Known Allergies      Medication List     STOP taking these medications    Pentips 32G X 4 MM Misc Generic drug: Insulin  Pen Needle       TAKE these medications    Accu-Chek Guide w/Device Kit Dispense based on patient and insurance preference. Use up to four times daily as directed. (FOR ICD-10 E10.9, E11.9).   aspirin EC 81 MG tablet Take 1 tablet (81 mg total) by mouth daily. Swallow whole. Start taking on: March 22, 2024   atorvastatin 80 MG tablet Commonly known as: LIPITOR Take 1 tablet (80 mg total) by mouth daily. Start taking on: March 22, 2024   clopidogrel 75 MG tablet Commonly known as: PLAVIX Take 1 tablet (75 mg total) by mouth daily. Start taking on: March 22, 2024   glipiZIDE  5 MG tablet Commonly known as: GLUCOTROL  Take 1 tablet (5 mg total) by mouth 2 (two) times daily before a meal.   levETIRAcetam 500 MG tablet Commonly known as: KEPPRA Take 1 tablet (500 mg total) by mouth 2 (two) times daily.   metFORMIN  1000 MG tablet Commonly known as: GLUCOPHAGE  Take 1 tablet (1,000 mg total) by mouth 2 (two) times daily with a meal.        Follow-up Information     [facility] Follow up on 03/28/2024.   Why: You are scheduled for a hospital follow up on Thursday,  03/28/2024 at 1020 am.  Please arrive 15 minutes early to fill out new patient paperwork. Contact information: [address] [place]  [address] [phone]        [facility]. Call.   Specialty: Rehabilitation Why: Please call the clinic and schedule [facility] follow up. Contact information: [address] [place]  [address] [phone]        Serene Gaile ORN, MD Follow up.    Specialties: Vascular Surgery, Cardiology Why: Hospital follow up, as scheduled Contact information: [address] [place] [address] [phone]         [facility]. Schedule an appointment as soon as possible for a visit in 1 month(s).   Specialty: Neurology Why: stroke clinic Contact information: [address] [place]  [address] [phone]               Discharge Exam: Bryan Mueller   03/18/24 2349  Weight: 72.6 kg   General exam: Awake, laying in bed, in nad Respiratory system: Normal respiratory effort, no wheezing Cardiovascular system: regular rate, s1, s2 Gastrointestinal system: Soft, nondistended, positive BS Central nervous system: CN2-12 grossly intact, strength intact Extremities: Perfused, no clubbing Skin: Normal skin turgor, no notable skin lesions seen Psychiatry: Mood normal // no visual hallucinations   Condition at discharge: fair  The results of significant diagnostics from this hospitalization (including imaging, microbiology, ancillary and laboratory) are listed below for reference.   Imaging Studies: VAS US  CAROTID Result Date: 03/20/2024 Carotid Arterial Duplex Study Patient Name:  Bryan Mueller  Date of Exam:   03/20/2024 Medical Rec #: [id number]       Accession #:    [id number] Date of Birth: May 31, 1970        Patient Gender: M Patient Age:   89 years Exam Location:  [facility] Procedure:      VAS US  CAROTID Referring Phys: ARY XU --------------------------------------------------------------------------------  Indications:       Carotid stenosis-left I65.23. Risk Factors:      Diabetes. Comparison Study:  03/19/2024 - CT ANGIO HEAD NECK W WO CM                     IMPRESSION:                    1. Advanced intracranial atherosclerosis (see #4) with no                    emergent large vessel occlusion.                    2. Positive for Bulky low-density  plaque at the left carotid                    bifurcation and ICA origin causing approximately 58%                    stenosis.                    3. 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                    4. 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 Up to  Moderate stenoses at the Left MCA origin, proximal left M1                    segment, Right MCA trifurcation, and Right PCA P2 segment. Performing Technologist: Cordella Collet RVT  Examination Guidelines: A complete evaluation includes B-mode imaging, spectral Doppler, color Doppler, and power Doppler as needed of all accessible portions of each vessel. Bilateral testing is considered an integral part of a complete examination. Limited examinations for reoccurring indications may be performed as noted.  Right Carotid Findings: +----------+--------+--------+--------+-----------------------+--------+           PSV cm/sEDV cm/sStenosisPlaque Description     Comments +----------+--------+--------+--------+-----------------------+--------+ CCA Prox  103     16              smooth and heterogenous         +----------+--------+--------+--------+-----------------------+--------+ CCA Distal71      19              smooth and heterogenous         +----------+--------+--------+--------+-----------------------+--------+ ICA Prox  53      22              smooth and heterogenous         +----------+--------+--------+--------+-----------------------+--------+ ICA Mid   77      33                                     tortuous +----------+--------+--------+--------+-----------------------+--------+ ICA Distal70      32                                     tortuous +----------+--------+--------+--------+-----------------------+--------+ ECA       174     19                                               +----------+--------+--------+--------+-----------------------+--------+ +----------+--------+-------+--------+-------------------+           PSV cm/sEDV cmsDescribeArm Pressure (mmHG) +----------+--------+-------+--------+-------------------+ Subclavian110                                        +----------+--------+-------+--------+-------------------+ +---------+--------+--+--------+--+---------+ VertebralPSV cm/s57EDV cm/s24Antegrade +---------+--------+--+--------+--+---------+  Left Carotid Findings: +----------+--------+--------+--------+-----------------------+--------+           PSV cm/sEDV cm/sStenosisPlaque Description     Comments +----------+--------+--------+--------+-----------------------+--------+ CCA Prox  104     23              smooth and heterogenous         +----------+--------+--------+--------+-----------------------+--------+ CCA Distal56      21              smooth and heterogenous         +----------+--------+--------+--------+-----------------------+--------+ ICA Prox  136     53      40-59%  smooth and heterogenous         +----------+--------+--------+--------+-----------------------+--------+ ICA Mid   107     34                                              +----------+--------+--------+--------+-----------------------+--------+  ICA Distal86      34                                     tortuous +----------+--------+--------+--------+-----------------------+--------+ ECA       50      14                                              +----------+--------+--------+--------+-----------------------+--------+ +----------+--------+--------+--------+-------------------+           PSV cm/sEDV cm/sDescribeArm Pressure (mmHG) +----------+--------+--------+--------+-------------------+ [id number]                                         +----------+--------+--------+--------+-------------------+  +---------+--------+--+--------+-+---------+ VertebralPSV cm/s47EDV cm/s8Antegrade +---------+--------+--+--------+-+---------+   Summary: Right Carotid: Velocities in the right ICA are consistent with a 1-39% stenosis. Left Carotid: Velocities in the left ICA are consistent with a 40-59% stenosis. Vertebrals: Bilateral vertebral arteries demonstrate antegrade flow. *See table(s) above for measurements and observations.  Electronically signed by Debby Robertson on 03/20/2024 at 4:51:02 PM.    Final    ECHOCARDIOGRAM COMPLETE Result Date: 03/19/2024    ECHOCARDIOGRAM REPORT   Patient Name:   MERREL CRABBE Date of Exam: 03/19/2024 Medical Rec #:  [id number]      Height:       66.0 in Accession #:    [id number]     Weight:       160.0 lb Date of Birth:  [birth year]       BSA:          1.819 m Patient Age:    53 years       BP:           102/73 mmHg Patient Gender: M              HR:           71 bpm. Exam Location:  Inpatient Procedure: 2D Echo and Strain Analysis (Both Spectral and Color Flow Doppler            were utilized during procedure). Indications:    stroke  History:        Patient has prior history of Echocardiogram examinations.  Sonographer:    Charmaine Gaskins Referring Phys: [id number] [facility] KHALIQDINA IMPRESSIONS  1. Left ventricular ejection fraction, by estimation, is 60 to 65%. The left ventricle has normal function. The left ventricle has no regional wall motion abnormalities. There is mild concentric left ventricular hypertrophy. Left ventricular diastolic parameters were normal. The average left ventricular global longitudinal strain is -18.2 %. The global longitudinal strain is normal.  2. Right ventricular systolic function is normal. The right ventricular size is normal.  3. The mitral valve is normal in structure. No evidence of mitral valve regurgitation. No evidence of mitral stenosis.  4. Small nodules on the aortic valve cusps are probably prominent Arantius nodules (normal variant).  The aortic valve is normal in structure. There is mild thickening of the aortic valve. Aortic valve regurgitation is not visualized. Aortic valve sclerosis is present, with no evidence of aortic valve stenosis.  5. The inferior vena cava is normal in size with greater than 50% respiratory variability, suggesting right atrial pressure of 3 mmHg. Conclusion(s)/Recommendation(s): No  intracardiac source of embolism detected on this transthoracic study. Consider a transesophageal echocardiogram to exclude cardiac source of embolism if clinically indicated. FINDINGS  Left Ventricle: Left ventricular ejection fraction, by estimation, is 60 to 65%. The left ventricle has normal function. The left ventricle has no regional wall motion abnormalities. The average left ventricular global longitudinal strain is -18.2 %. Strain was performed and the global longitudinal strain is normal. The left ventricular internal cavity size was normal in size. There is mild concentric left ventricular hypertrophy. Left ventricular diastolic parameters were normal. Right Ventricle: The right ventricular size is normal. No increase in right ventricular wall thickness. Right ventricular systolic function is normal. Left Atrium: Left atrial size was normal in size. Right Atrium: Right atrial size was normal in size. Pericardium: There is no evidence of pericardial effusion. Mitral Valve: The mitral valve is normal in structure. No evidence of mitral valve regurgitation. No evidence of mitral valve stenosis. Tricuspid Valve: The tricuspid valve is normal in structure. Tricuspid valve regurgitation is not demonstrated. No evidence of tricuspid stenosis. Aortic Valve: Small nodules on the aortic valve cusps are probably prominent Arantius nodules (normal variant). The aortic valve is normal in structure. There is mild thickening of the aortic valve. Aortic valve regurgitation is not visualized. Aortic valve sclerosis is present, with no evidence of  aortic valve stenosis. Pulmonic Valve: The pulmonic valve was normal in structure. Pulmonic valve regurgitation is trivial. No evidence of pulmonic stenosis. Aorta: The aortic root is normal in size and structure. Venous: The inferior vena cava is normal in size with greater than 50% respiratory variability, suggesting right atrial pressure of 3 mmHg. IAS/Shunts: No atrial level shunt detected by color flow Doppler.  LEFT VENTRICLE PLAX 2D LVIDd:         4.20 cm   Diastology LVIDs:         2.30 cm   LV e' medial:    6.64 cm/s LV PW:         1.20 cm   LV E/e' medial:  12.0 LV IVS:        1.20 cm   LV e' lateral:   8.27 cm/s LVOT diam:     2.17 cm   LV E/e' lateral: 9.6 LVOT Area:     3.70 cm                          2D Longitudinal Strain                          2D Strain GLS Avg:     -18.2 % RIGHT VENTRICLE RV Basal diam:  2.58 cm RV Mid diam:    2.73 cm RV S prime:     14.70 cm/s LEFT ATRIUM             Index        RIGHT ATRIUM           Index LA diam:        3.06 cm 1.68 cm/m   RA Area:     14.30 cm LA Vol (A2C):   51.1 ml 28.09 ml/m  RA Volume:   31.80 ml  17.48 ml/m LA Vol (A4C):   43.7 ml 24.03 ml/m LA Biplane Vol: 48.3 ml 26.55 ml/m   AORTA Ao Root diam: 3.21 cm Ao Asc diam:  3.06 cm MITRAL VALVE MV Area (PHT): 3.42 cm    SHUNTS  MV Decel Time: 222 msec    Systemic Diam: 2.17 cm MV E velocity: 79.70 cm/s MV A velocity: 74.40 cm/s MV E/A ratio:  1.07 Mihai Croitoru MD Electronically signed by Jerel Balding MD Signature Date/Time: 03/19/2024/1:07:24 PM    Final    EEG adult Result Date: 03/19/2024 Shelton Arlin KIDD, MD     03/19/2024 11:16 AM Patient Name: Gaylin Bulthuis MRN: [id number] Epilepsy Attending: Arlin KIDD Shelton Referring Physician/Provider: Vanessa Robert, MD Date: 03/19/2024 Duration: 23.05 mins Patient history: 53 y.o. male with hx of DM2 that is poorly controlled who presents with confusion looking at the ceiling. EEG to evaluate for seizure Level of alertness: Awake, asleep AEDs  during EEG study: LEV Technical aspects: This EEG study was done with scalp electrodes positioned according to the 10-20 International system of electrode placement. Electrical activity was reviewed with band pass filter of 1-70Hz , sensitivity of 7 uV/mm, display speed of 18mm/sec with a 60Hz  notched filter applied as appropriate. EEG data were recorded continuously and digitally stored.  Video monitoring was available and reviewed as appropriate. Description: The posterior dominant rhythm consists of 8-9Hz  activity of moderate voltage (25-35 uV) seen predominantly in posterior head regions, symmetric and reactive to eye opening and eye closing. Sleep was characterized by vertex waves, sleep spindles (12 to 14 Hz), maximal frontocentral region. Hyperventilation and photic stimulation were not performed.   IMPRESSION: This study is within normal limits. No seizures or epileptiform discharges were seen throughout the recording. A normal interictal EEG does not exclude the diagnosis of epilepsy. Priyanka KIDD Shelton   CT ANGIO HEAD NECK W WO CM Result Date: 03/19/2024 EXAM: CTA HEAD AND NECK WITHOUT AND WITH 03/19/2024 07:17:16 AM TECHNIQUE: CTA of the head and neck was performed without and with the administration of intravenous contrast. Multiplanar 2D and/or 3D reformatted images are provided for review. Automated exposure control, iterative reconstruction, and/or weight based adjustment of the mA/kV was utilized to reduce the radiation dose to as low as reasonably achievable. Stenosis of the internal carotid arteries measured using NASCET criteria. 75 mL of iohexol  (OMNIPAQUE ) 350 MG/ML injection was administered. COMPARISON: Brain MRI and head CT earlier today reported separately. CLINICAL HISTORY: 52 year old male with acute neuro deficit, stroke suspected, and scattered acute on chronic left MCA infarcts. FINDINGS: CTA NECK: AORTIC ARCH AND ARCH VESSELS: Normal 3 vessel arch. No arch atherosclerosis. No  dissection or arterial injury. No significant stenosis of the brachiocephalic or subclavian arteries. CERVICAL CAROTID ARTERIES: Right carotid bifurcation, proximal right ICA: Soft and calcified plaque with less than 50% stenosis. Left CCA origin: Soft more so than calcified plaque on series 5 image 166 without stenosis. Left carotid bifurcation, left ICA origin and bulb: Bulky low density soft more so than calcified plaque. Maximal stenosis on series 19 image 408 where the vessel is narrowed to 2 mm versus 4.8 mm normally, resulting in 58% stenosis. No dissection or arterial injury. CERVICAL VERTEBRAL ARTERIES: Right subclavian origin: Calcified plaque without stenosis. Dominant right vertebral artery: Normal to the skull base. There is mild right V4 segment calcified plaque without stenosis. The right vertebral supplies the basilar. Left vertebral artery: Diminutive, with a normal origin. 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 Left V3 segment is irregular in an area of prominent muscular branching on series 19 image 339, with evidence of soft plaque there. The left V3 and V4 segment appear atherosclerotic, and the left V4 segment is occluded in the  posterior fossa. There is reconstitution of the left PICA from the left AICA. No dissection or arterial injury. LUNGS AND MEDIASTINUM: Negative visible upper chest. SOFT TISSUES: Negative nonvascular neck soft tissue spaces. Intermittent carious dentition. BONES: Mild for age spine degeneration. CTA HEAD: ANTERIOR CIRCULATION: Left ICA siphon is patent with no significant plaque or stenosis. Right ICA siphon is patent and appears normal. MCA and ACA origins are patent but irregular. Left MCA origin: Moderate stenosis along with irregularity of the proximal left M1 segment as seen on series 13 image 18. Distal left M1 remains patent. Left MCA bifurcation appears patent without stenosis. No left MCA branch  occlusion is identified. Right MCA origin: Mildly stenotic. Right MCA M1 segment is mildly to moderately irregular without additional stenosis. Right MCA trifurcation is patent with moderate irregularity. No right MCA branch occlusion is identified. Right ACA A1 segment: Moderate to severe stenosis on series 15 image 21. Left ACA A2 segment: Dominant. Bilateral ACA branches are within normal limits. Normal anterior communicating artery. No aneurysm. POSTERIOR CIRCULATION: Basilar artery is patent without stenosis. Normal PCA origins. Right PCA P2 segment: Mildly to moderately irregular and stenotic on series 17 image 14. Left PCA branches are normal. Diminutive or absent posterior communicating arteries. Normal right PICA. No aneurysm. OTHER: Major dural venous sinuses are enhancing and appear to be patent. IMPRESSION: 1. Advanced intracranial atherosclerosis (see #4) with no emergent large vessel occlusion. 2. Positive for Bulky low-density plaque at the left carotid bifurcation and ICA origin causing approximately 58% stenosis. 3. 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 4. 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 Up to Moderate stenoses at the Left MCA origin, proximal left M1 segment, Right MCA trifurcation, and Right PCA P2 segment. Electronically signed by: Helayne Hurst MD 03/19/2024 07:32 AM EST RP Workstation: [id number]   MR BRAIN WO CONTRAST Result Date: 03/19/2024 EXAM: MRI BRAIN WITHOUT CONTRAST 03/19/2024 05:00:00 AM TECHNIQUE: Multiplanar multisequence MRI of the head/brain was performed without the administration of intravenous contrast. COMPARISON: Head CT comparison 06/06/2023 01:28:00 AM. CLINICAL HISTORY: 53 year old male. Neuro deficit, acute, stroke suspected. FINDINGS: BRAIN AND VENTRICLES: Preserved major vascular flow voids with dominant appearance of the distal right vertebral artery, a normal variant. Chronic  encephalomalacia of the left middle frontal gyrus corresponding to the non-contrast CT finding today (series 6 image 43). Scattered additional foci of restricted diffusion including adjacent to that gyrus, and elsewhere in the distal left MCA territory extending cephalad from the superior left insula and operculum level (with 1 of the larger cortical areas of involvement on series 5 image 93 measuring 8 mm). Confluent petechial hemorrhage or chronic hemosiderin associated with the dominant nonacute lesion on series 12 image 53. T2 and FLAIR hyperintense cytotoxic edema in the areas of acute involvement. No malignant hemorrhagic transformation. No intracranial mass effect. No contralateral or posterior circulation restricted diffusion. No other cerebral blood products identified. Deep gray nuclei, brainstem and cerebellum appear negative. Minimal nonspecific right hemisphere white matter T2 and FLAIR hyperintensity. No mass. No midline shift. No hydrocephalus. The sella is unremarkable. ORBITS: No acute abnormality. SINUSES AND MASTOIDS: No acute abnormality. BONES AND SOFT TISSUES: Normal marrow signal. No acute soft tissue abnormality. Negative visible cervical spine. IMPRESSION: 1. Widely scattered small acute on nonacute left MCA territory infarcts (largest 8 mm). 2. Confluent petechial hemorrhage at the area of CT abnormality, which appears largely nonacute. No malignant hemorrhagic transformation or mass effect. 3.  Negative for age non-contrast MRI appearance of the brain otherwise. Electronically signed by: Helayne Hurst MD 03/19/2024 05:59 AM EST RP Workstation: [id number]   CT Head Wo Contrast Result Date: 03/19/2024 EXAM: CT HEAD WITHOUT CONTRAST 03/19/2024 01:47:35 AM TECHNIQUE: CT of the head was performed without the administration of intravenous contrast. Automated exposure control, iterative reconstruction, and/or weight based adjustment of the mA/kV was utilized to reduce the radiation dose to as  low as reasonably achievable. COMPARISON: None available. CLINICAL HISTORY: Mental status change, unknown cause FINDINGS: BRAIN AND VENTRICLES: No acute hemorrhage. Age indeterminate infarct in the left frontal lobe (image 24) possibly acute. No hydrocephalus. No extra-axial collection. No mass effect or midline shift. ORBITS: No acute abnormality. SINUSES: No acute abnormality. SOFT TISSUES AND SKULL: No acute soft tissue abnormality. No skull fracture. IMPRESSION: 1. 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 Electronically signed by: Pinkie Pebbles MD 03/19/2024 01:52 AM EST RP Workstation: [id number]   DG Chest Portable 1 View Result Date: 03/19/2024 EXAM: 1 VIEW(S) XRAY OF THE CHEST 03/19/2024 12:45:00 AM COMPARISON: 09/07/2014 CLINICAL HISTORY: AMS FINDINGS: LUNGS AND PLEURA: Low lung volumes with vascular crowding. Mild bibasilar opacities, likely atelectasis. No pulmonary edema. No pleural effusion. No pneumothorax. HEART AND MEDIASTINUM: No acute abnormality of the cardiac and mediastinal silhouettes. BONES AND SOFT TISSUES: No acute osseous abnormality. IMPRESSION: 1. Low lung volumes with bibasilar atelectasis. Electronically signed by: Pinkie Pebbles MD 03/19/2024 12:47 AM EST RP Workstation: [id number]    Microbiology: Results for orders placed or performed during the hospital encounter of 12/14/21  Blood culture (routine x 2)     Status: Abnormal   Collection Time: 12/15/21  6:06 AM   Specimen: BLOOD LEFT FOREARM  Result Value Ref Range Status   Specimen Description BLOOD LEFT FOREARM  Final   Special Requests   Final    BOTTLES DRAWN AEROBIC AND ANAEROBIC Blood Culture adequate volume   Culture  Setup Time   Final    GRAM POSITIVE COCCI IN CLUSTERS IN BOTH AEROBIC AND ANAEROBIC BOTTLES CRITICAL RESULT CALLED TO, READ BACK BY AND VERIFIED WITH: PHARMD A. PAYTES [id number] @0806  FH Performed at [facility], [address] [place], [place] [address]     Culture STAPHYLOCOCCUS LUGDUNENSIS (A)  Final   Report Status 12/18/2021 FINAL  Final   Organism ID, Bacteria STAPHYLOCOCCUS LUGDUNENSIS  Final      Susceptibility   Staphylococcus lugdunensis - MIC*    CIPROFLOXACIN <=0.5 SENSITIVE Sensitive     ERYTHROMYCIN <=0.25 SENSITIVE Sensitive     GENTAMICIN <=0.5 SENSITIVE Sensitive     OXACILLIN 2 SENSITIVE Sensitive     TETRACYCLINE <=1 SENSITIVE Sensitive     VANCOMYCIN <=0.5 SENSITIVE Sensitive     TRIMETH /SULFA  <=10 SENSITIVE Sensitive     CLINDAMYCIN  <=0.25 SENSITIVE Sensitive     RIFAMPIN <=0.5 SENSITIVE Sensitive     Inducible Clindamycin  NEGATIVE Sensitive     * STAPHYLOCOCCUS LUGDUNENSIS  Blood Culture ID Panel (Reflexed)     Status: Abnormal   Collection Time: 12/15/21  6:06 AM  Result Value Ref Range Status   Enterococcus faecalis NOT DETECTED NOT DETECTED Final   Enterococcus Faecium NOT DETECTED NOT DETECTED Final   Listeria monocytogenes NOT DETECTED NOT DETECTED Final   Staphylococcus species DETECTED (A) NOT DETECTED Final    Comment: CRITICAL RESULT CALLED TO, READ BACK BY AND VERIFIED WITH: PHARMD A. PAYTES [id number] @0807  FH    Staphylococcus aureus (BCID) NOT DETECTED NOT DETECTED Final   Staphylococcus epidermidis  NOT DETECTED NOT DETECTED Final   Staphylococcus lugdunensis DETECTED (A) NOT DETECTED Final    Comment: CRITICAL RESULT CALLED TO, READ BACK BY AND VERIFIED WITH: PHARMD A. PAYTES [id number] @0807  FH    Streptococcus species NOT DETECTED NOT DETECTED Final   Streptococcus agalactiae NOT DETECTED NOT DETECTED Final   Streptococcus pneumoniae NOT DETECTED NOT DETECTED Final   Streptococcus pyogenes NOT DETECTED NOT DETECTED Final   A.calcoaceticus-baumannii NOT DETECTED NOT DETECTED Final   Bacteroides fragilis NOT DETECTED NOT DETECTED Final   Enterobacterales NOT DETECTED NOT DETECTED Final   Enterobacter cloacae complex NOT DETECTED NOT DETECTED Final   Escherichia coli NOT DETECTED NOT DETECTED Final    Klebsiella aerogenes NOT DETECTED NOT DETECTED Final   Klebsiella oxytoca NOT DETECTED NOT DETECTED Final   Klebsiella pneumoniae NOT DETECTED NOT DETECTED Final   Proteus species NOT DETECTED NOT DETECTED Final   Salmonella species NOT DETECTED NOT DETECTED Final   Serratia marcescens NOT DETECTED NOT DETECTED Final   Haemophilus influenzae NOT DETECTED NOT DETECTED Final   Neisseria meningitidis NOT DETECTED NOT DETECTED Final   Pseudomonas aeruginosa NOT DETECTED NOT DETECTED Final   Stenotrophomonas maltophilia NOT DETECTED NOT DETECTED Final   Candida albicans NOT DETECTED NOT DETECTED Final   Candida auris NOT DETECTED NOT DETECTED Final   Candida glabrata NOT DETECTED NOT DETECTED Final   Candida krusei NOT DETECTED NOT DETECTED Final   Candida parapsilosis NOT DETECTED NOT DETECTED Final   Candida tropicalis NOT DETECTED NOT DETECTED Final   Cryptococcus neoformans/gattii NOT DETECTED NOT DETECTED Final   Methicillin resistance mecA/C NOT DETECTED NOT DETECTED Final    Comment: Performed at [facility], [address] [place], [place] [address]  Blood culture (routine x 2)     Status: None   Collection Time: 12/15/21  6:07 AM   Specimen: BLOOD RIGHT FOREARM  Result Value Ref Range Status   Specimen Description BLOOD RIGHT FOREARM  Final   Special Requests   Final    BOTTLES DRAWN AEROBIC AND ANAEROBIC Blood Culture results may not be optimal due to an inadequate volume of blood received in culture bottles   Culture   Final    NO GROWTH 5 DAYS Performed at [facility], [address] [place], [place] [address]    Report Status 12/20/2021 FINAL  Final  Culture, blood (Routine X 2) w Reflex to ID Panel     Status: None   Collection Time: 12/17/21  2:35 PM   Specimen: BLOOD  Result Value Ref Range Status   Specimen Description BLOOD RIGHT ANTECUBITAL  Final   Special Requests   Final    BOTTLES DRAWN AEROBIC ONLY Blood Culture results may not be optimal due to  an inadequate volume of blood received in culture bottles   Culture   Final    NO GROWTH 5 DAYS Performed at [facility], [address], [place], [place] [address]    Report Status 12/22/2021 FINAL  Final  Culture, blood (Routine X 2) w Reflex to ID Panel     Status: None   Collection Time: 12/17/21  2:35 PM   Specimen: BLOOD  Result Value Ref Range Status   Specimen Description BLOOD RIGHT ANTECUBITAL  Final   Special Requests   Final    BOTTLES DRAWN AEROBIC ONLY Blood Culture results may not be optimal due to an inadequate volume of blood received in culture bottles   Culture   Final  NO GROWTH 5 DAYS Performed at [facility], [address] [place], [place] [address]    Report Status 12/22/2021 FINAL  Final    Labs: CBC: Recent Labs  Lab 03/19/24 0010 03/21/24 0443  WBC 10.3 10.8*  NEUTROABS 7.7  --   HGB 13.7 14.7  HCT 38.5* 41.2  MCV 88.7 87.5  PLT 233 236   Basic Metabolic Panel: Recent Labs  Lab 03/19/24 0010 03/21/24 0443  NA 134* 136  K 4.2 4.1  CL 98 98  CO2 27 27  GLUCOSE 436* 135*  BUN 13 14  CREATININE 0.92 0.82  CALCIUM  8.5* 9.2   Liver Function Tests: Recent Labs  Lab 03/19/24 0010 03/21/24 0443  AST 12* 22  ALT 8 16  ALKPHOS 173* 116  BILITOT 0.2 0.9  PROT 6.4* 6.2*  ALBUMIN 3.5 2.9*   CBG: Recent Labs  Lab 03/20/24 2057 03/21/24 0005 03/21/24 0509 03/21/24 0751 03/21/24 1139  GLUCAP 188* 156* 165* 154* 205*    Discharge time spent: less than 30 minutes.  Signed: Garnette Pelt, MD [facility] 03/21/2024

## 2024-03-21 NOTE — H&P (View-Only) (Signed)
  Progress Note    03/21/2024 8:15 AM * No surgery found *  Subjective:  No events overnight   Vitals:   03/21/24 0600 03/21/24 0753  BP: 116/78 118/65  Pulse: 78 75  Resp:  18  Temp: 98.3 F (36.8 C) 98.8 F (37.1 C)  SpO2: 100% 100%   Physical Exam: Lungs:  non labored Extremities:  moving all ext well Neurologic: symmetrical grip strength, no aphasia or slurring  CBC    Component Value Date/Time   WBC 10.8 (H) 03/21/2024 0443   RBC 4.71 03/21/2024 0443   HGB 14.7 03/21/2024 0443   HCT 41.2 03/21/2024 0443   PLT 236 03/21/2024 0443   MCV 87.5 03/21/2024 0443   MCH 31.2 03/21/2024 0443   MCHC 35.7 03/21/2024 0443   RDW 12.2 03/21/2024 0443   LYMPHSABS 1.5 03/19/2024 0010   MONOABS 0.9 03/19/2024 0010   EOSABS 0.1 03/19/2024 0010   BASOSABS 0.1 03/19/2024 0010    BMET    Component Value Date/Time   NA 136 03/21/2024 0443   K 4.1 03/21/2024 0443   CL 98 03/21/2024 0443   CO2 27 03/21/2024 0443   GLUCOSE 135 (H) 03/21/2024 0443   BUN 14 03/21/2024 0443   CREATININE 0.82 03/21/2024 0443   CALCIUM  9.2 03/21/2024 0443   GFRNONAA >60 03/21/2024 0443   GFRAA >60 06/02/2018 2131    INR    Component Value Date/Time   INR 0.91 09/07/2014 0929     Intake/Output Summary (Last 24 hours) at 03/21/2024 0815 Last data filed at 03/20/2024 1800 Gross per 24 hour  Intake 250 ml  Output 550 ml  Net -300 ml     Assessment/Plan:  53 y.o. male with symptomatic L ICA  No further neuro events overnight Plan is for L carotid endarterectomy next week with Dr. Pearline Belton for discharge prior to surgery; Our office will call to arrange Continue aspirin, plavix, statin    Donnice Sender, PA-C [facility] [phone] 03/21/2024 8:15 AM   VASCULAR STAFF ADDENDUM: I have independently interviewed and examined the patient. I agree with the above.  Discussed the risks and benefits of carotid intervention.  Specifically given his age and  location of the lesion I recommended carotid endarterectomy.  He expressed understanding and elected to proceed. Will plan for left carotid endarterectomy on Tuesday next week.  He is okay for discharge from the surgical perspective and can come back as an elective case on Tuesday. No need to stop aspirin or Plavix perioperatively.  Norman GORMAN Pearline MD [facility] Phone Number: [phone] 03/21/2024 1:13 PM

## 2024-03-21 NOTE — Progress Notes (Signed)
 STROKE TEAM PROGRESS NOTE   INTERIM HISTORY/SUBJECTIVE Wife is at the bedside. Pt sitting in chair for lunch.  Vascular surgery consulted, plan for CEA next week.  Carotid Doppler yesterday showed left ICA 40 to 59% stenosis.  OBJECTIVE  CBC    Component Value Date/Time   WBC 10.8 (H) 03/21/2024 0443   RBC 4.71 03/21/2024 0443   HGB 14.7 03/21/2024 0443   HCT 41.2 03/21/2024 0443   PLT 236 03/21/2024 0443   MCV 87.5 03/21/2024 0443   MCH 31.2 03/21/2024 0443   MCHC 35.7 03/21/2024 0443   RDW 12.2 03/21/2024 0443   LYMPHSABS 1.5 03/19/2024 0010   MONOABS 0.9 03/19/2024 0010   EOSABS 0.1 03/19/2024 0010   BASOSABS 0.1 03/19/2024 0010    BMET    Component Value Date/Time   NA 136 03/21/2024 0443   K 4.1 03/21/2024 0443   CL 98 03/21/2024 0443   CO2 27 03/21/2024 0443   GLUCOSE 135 (H) 03/21/2024 0443   BUN 14 03/21/2024 0443   CREATININE 0.82 03/21/2024 0443   CALCIUM  9.2 03/21/2024 0443   GFRNONAA >60 03/21/2024 0443    IMAGING past 24 hours No results found.   Vitals:   03/21/24 0005 03/21/24 0600 03/21/24 0753 03/21/24 1140  BP: 123/77 116/78 118/65 107/72  Pulse: 82 78 75 77  Resp: 18  18 18   Temp: 98.5 F (36.9 C) 98.3 F (36.8 C) 98.8 F (37.1 C)   TempSrc: Oral  Oral   SpO2: 100% 100% 100% 100%  Weight:      Height:         PHYSICAL EXAM General:  Alert, well-nourished, well-developed patient in no acute distress Psych:  Mood and affect appropriate for situation CV: Regular rate and rhythm on monitor Respiratory:  Regular, unlabored respirations on room air GI: Abdomen soft and nontender   NEURO:  awake, alert, eyes open, orientated to age, place, time and people. No aphasia, fluent language, following all simple commands, with accent but no dysarthria per wife. Able to name and repeat. No gaze palsy, tracking bilaterally, visual field full. No facial droop. Tongue midline. Bilateral UEs 5/5, no drift. Bilaterally LEs 5/5, no drift. Sensation  symmetrical bilaterally, b/l FTN intact, gait not tested    ASSESSMENT/PLAN  [name] is a 53 y.o. male with history of DM2 that is poorly controlled who presents with confusion looking at the ceiling. He had an episode at 1500 yesterday and then at 2200 yesterda. Wife witnessed and his head was turned up and he was looking up in the ceiling. He was not responding to questions. No post ictal period. Patient has some recollection of this. EMS called and glucose was 475. He was iniitally brought to [facility] where CT Head was concerning for a small acute/subacute appearing L frontal stroke. He was transferred to [facility] for MRI Brain overnight which shows scattered L MCA distribution strokes. NIH on Admission 0  Stroke:  left MCA scattered small/punctate infarcts, etiology: likely from left ICA and MCA stenosis  Code Stroke CT head Age-indeterminate left frontal lobe infarct  CTA head & neck Positive for Bulky low-density plaque at the left carotid bifurcation and ICA origin causing approximately 58% stenosis. 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 Up to Moderate stenoses at the Left MCA origin, proximal left M1 segment, Right MCA trifurcation, and Right P2 MRI  Widely scattered small acute  on nonacute left MCA territory infarcts  2D Echo EF 60-65% EEG - This study is within normal limits. No seizures or epileptiform discharges were seen throughout the recording.  LDL 146, TG 343 HgbA1c 9.5 UDS negative VTE prophylaxis - SCDs No antithrombotic prior to admission, now on aspirin 81 mg daily and clopidogrel 75 mg daily.  Further regimen and duration per VVS next week. Therapy recommendations:  outpt PT Disposition: Home today  Seizures 2 Episodes with eyes rolling back, head turning and shaking jerking, concerning for seizure EEG no seizure Keppra load of 1500mg  IV once,  followed by 500mg  BID No driving for 6 months. Has to be seizure free for 6 months and under physician's care before he can resume driving Detailed seizure precautions listed below and also in the discharge instructions.  Carotid stenosis CTA neck showed Bulky low-density plaque at the left carotid bifurcation and ICA origin causing approximately 58% stenosis. Likely symptomatic at this time Carotid Dopple left ICA 40 to 59% stenosis Vascular surgery on board, plan for L CEA next week  Diabetes type II Uncontrolled Home meds:  Insulin , metformin   HgbA1c 9.5, goal < 7.0 Hyperglycemia on admission CBGs SSI Recommend close follow-up with PCP for better DM control  BP management No history of hypertension BP stable Avoid low BP Long-term BP goal normotensive  Hyperlipidemia Home meds none LDL 146, TG 343 On Lipitor 80 Continue statin on discharge  Tobacco Abuse Patient chews tobacco       Ready to quit? Yes Nicotine replacement therapy provided  Substance Abuse Chronic THC user UDS pending Cessation education will be provided  Other stroke risk factors   Other acute medical issue Elevated ammonia at 52, management per primary team  Hospital day # 1  Neurology will sign off. Please call with questions. Pt will follow up with stroke clinic NP at [facility] in about 4 weeks. Thanks for the consult.   Ary Cummins, MD PhD Stroke Neurology 03/21/2024 4:23 PM   Per [place] statutes, patients with seizures are not allowed to drive until they have been seizure-free for six months Use caution when using heavy equipment or power tools. Avoid working on ladders or at heights. Take showers instead of baths. Ensure the water temperature is not too high on the home water heater. Do not go swimming alone. Do not lock yourself in a room alone (i.e. bathroom). When caring for infants or small children, sit down when holding, feeding, or changing them to minimize risk of injury  to the child in the event you have a seizure. Maintain good sleep hygiene. Avoid alcohol. After a seizure, most patients experience confusion, fatigue, muscle pain and/or a headache. Thus, one should permit the individual to sleep. For the next few days, reassurance is essential. Being calm and helping reorient the person is also of importance.   Most seizures are painless and end spontaneously. Seizures are not harmful to others but can lead to complications such as stress on the lungs, brain and the heart. Individuals with prior lung problems may develop labored breathing and respiratory distress.      To contact Stroke Continuity provider, please refer to [email or web address]. After hours, contact General Neurology

## 2024-03-21 NOTE — Progress Notes (Signed)
 Physical Therapy Treatment Patient Details Name: Bryan Mueller MRN: [id number] DOB: 07/28/1970 Today's Date: 03/21/2024   History of Present Illness 53 y.o. male presented via EMS 11/11 after episode of dizziness and confusion. CBG 475 Experienced similar episode earlier in the day at work. 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 MRI brain WO showed widely scattered small acute on nonacute left MCA territory infarcts Non compliant with DM medication due to inability to afford. PMH: DM2    PT Comments  Pt asleep on entry, reports not sleeping well last night, but agreeable to working with PT. Pt is grossly supervision for bed mobility, transfers and ambulation. Worked with pt on balance exercises, especially working outside his BoS with both UE and LE. Recommended pt wear closed heel shoes, until his balance is better and work on increased foot clearance. Pt is in agreement and wife reports she will remind him. Pt did do much better with way finding today. D/c plans remain appropriate. PT will continue to work with pt acutely.     If plan is discharge home, recommend the following: Assist for transportation   Can travel by private vehicle        Equipment Recommendations  None recommended by PT    Recommendations for Other Services OT consult (have asked OT to look at cognition)     Precautions / Restrictions Precautions Precautions: Fall Restrictions Weight Bearing Restrictions Per Provider Order: No     Mobility  Bed Mobility Overal bed mobility: Independent             General bed mobility comments: from flattened bed    Transfers Overall transfer level: Modified independent                 General transfer comment: good power up, increased time to self steady    Ambulation/Gait Ambulation/Gait assistance: Contact guard assist, Supervision Gait Distance (Feet): 300 Feet Assistive device: None Gait Pattern/deviations: Step-through pattern, Drifts  right/left Gait velocity: WFL     General Gait Details: pt is overall supervision for ambulation, some drifting in hallway but overall improved. Encouraged pt to wear sneakers or closed heel shoes until his stability returns    Modified Rankin (Stroke Patients Only) Modified Rankin (Stroke Patients Only) Pre-Morbid Rankin Score: No symptoms Modified Rankin: Slight disability     Balance Overall balance assessment: Needs assistance                                          Communication Communication Communication: No apparent difficulties  Cognition Arousal: Alert Behavior During Therapy: Flat affect   PT - Cognitive impairments: Sequencing, Problem solving, Initiation                       PT - Cognition Comments: continues to have some problem solving difficulties but is better than yesterday and he is easily able to find his way back to his room Following commands: Intact      Cueing Cueing Techniques: Verbal cues, Tactile cues, Visual cues  Exercises Other Exercises Other Exercises: balance exercises, stepping outside BoS, then reaching outside BoS and then both, called out randomly at increasing pace, discussed using this as an exercise while standing at a counter to mimic his work in as a [employer] comments (skin integrity, edema, etc.): wife in room, VSS on RA  Pertinent Vitals/Pain Pain Assessment Pain Assessment: No/denies pain     PT Goals (current goals can now be found in the care plan section) Acute Rehab PT Goals Patient Stated Goal: go back to work PT Goal Formulation: With patient/family Time For Goal Achievement: 04/03/24 Potential to Achieve Goals: Good Progress towards PT goals: Progressing toward goals    Frequency    Min 3X/week       AM-PAC PT 6 Clicks Mobility   Outcome Measure  Help needed turning from your back to your side while in a flat bed without using bedrails?:  None Help needed moving from lying on your back to sitting on the side of a flat bed without using bedrails?: None Help needed moving to and from a bed to a chair (including a wheelchair)?: None Help needed standing up from a chair using your arms (e.g., wheelchair or bedside chair)?: None Help needed to walk in hospital room?: None Help needed climbing 3-5 steps with a railing? : A Little 6 Click Score: 23    End of Session Equipment Utilized During Treatment: Gait belt Activity Tolerance: Patient tolerated treatment well Patient left: in bed;with call bell/phone within reach;with family/visitor present Nurse Communication: Mobility status PT Visit Diagnosis: Unsteadiness on feet (R26.81);Difficulty in walking, not elsewhere classified (R26.2)     Time: 9047-8984 PT Time Calculation (min) (ACUTE ONLY): 23 min  Charges:    $Gait Training: 8-22 mins $Neuromuscular Re-education: 8-22 mins PT General Charges $$ ACUTE PT VISIT: 1 Visit                     Bryan Mueller PT, DPT Acute Rehabilitation Services Please use secure chat or  Call Office [phone]    Bryan Mueller [facility] 03/21/2024, 12:48 PM

## 2024-03-21 NOTE — Progress Notes (Addendum)
  Progress Note    03/21/2024 8:15 AM * No surgery found *  Subjective:  No events overnight   Vitals:   03/21/24 0600 03/21/24 0753  BP: 116/78 118/65  Pulse: 78 75  Resp:  18  Temp: 98.3 F (36.8 C) 98.8 F (37.1 C)  SpO2: 100% 100%   Physical Exam: Lungs:  non labored Extremities:  moving all ext well Neurologic: symmetrical grip strength, no aphasia or slurring  CBC    Component Value Date/Time   WBC 10.8 (H) 03/21/2024 0443   RBC 4.71 03/21/2024 0443   HGB 14.7 03/21/2024 0443   HCT 41.2 03/21/2024 0443   PLT 236 03/21/2024 0443   MCV 87.5 03/21/2024 0443   MCH 31.2 03/21/2024 0443   MCHC 35.7 03/21/2024 0443   RDW 12.2 03/21/2024 0443   LYMPHSABS 1.5 03/19/2024 0010   MONOABS 0.9 03/19/2024 0010   EOSABS 0.1 03/19/2024 0010   BASOSABS 0.1 03/19/2024 0010    BMET    Component Value Date/Time   NA 136 03/21/2024 0443   K 4.1 03/21/2024 0443   CL 98 03/21/2024 0443   CO2 27 03/21/2024 0443   GLUCOSE 135 (H) 03/21/2024 0443   BUN 14 03/21/2024 0443   CREATININE 0.82 03/21/2024 0443   CALCIUM  9.2 03/21/2024 0443   GFRNONAA >60 03/21/2024 0443   GFRAA >60 06/02/2018 2131    INR    Component Value Date/Time   INR 0.91 09/07/2014 0929     Intake/Output Summary (Last 24 hours) at 03/21/2024 0815 Last data filed at 03/20/2024 1800 Gross per 24 hour  Intake 250 ml  Output 550 ml  Net -300 ml     Assessment/Plan:  53 y.o. male with symptomatic L ICA  No further neuro events overnight Plan is for L carotid endarterectomy next week with Dr. Pearline Belton for discharge prior to surgery; Our office will call to arrange Continue aspirin, plavix, statin    Donnice Sender, PA-C [facility] [phone] 03/21/2024 8:15 AM   VASCULAR STAFF ADDENDUM: I have independently interviewed and examined the patient. I agree with the above.  Discussed the risks and benefits of carotid intervention.  Specifically given his age and  location of the lesion I recommended carotid endarterectomy.  He expressed understanding and elected to proceed. Will plan for left carotid endarterectomy on Tuesday next week.  He is okay for discharge from the surgical perspective and can come back as an elective case on Tuesday. No need to stop aspirin or Plavix perioperatively.  Norman GORMAN Pearline MD [facility] Phone Number: [phone] 03/21/2024 1:13 PM

## 2024-03-22 ENCOUNTER — Telehealth: Payer: Self-pay

## 2024-03-22 NOTE — Telephone Encounter (Signed)
 Attempted to call for surgery scheduling. Call cannot be completed as dialed

## 2024-03-22 NOTE — Telephone Encounter (Signed)
 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  Restaurant doesn't open until 5p.  Unable to leave a message.

## 2024-03-22 NOTE — Telephone Encounter (Signed)
 Attempt to call daughter - number is for a Chiropodist.  Unable to leave message.

## 2024-03-22 NOTE — Telephone Encounter (Signed)
 Email sent to patient's wife (email on file), requesting that she contact [facility] Office for surgery scheduling.

## 2024-03-25 ENCOUNTER — Other Ambulatory Visit: Payer: Self-pay

## 2024-03-25 ENCOUNTER — Encounter (HOSPITAL_COMMUNITY): Payer: Self-pay | Admitting: Vascular Surgery

## 2024-03-25 DIAGNOSIS — I639 Cerebral infarction, unspecified: Secondary | ICD-10-CM

## 2024-03-25 NOTE — Anesthesia Preprocedure Evaluation (Signed)
 Anesthesia Evaluation  Patient identified by MRN, date of birth, ID band Patient awake    Reviewed: Allergy & Precautions, NPO status , Patient's Chart, lab work & pertinent test results  Airway Mallampati: I  TM Distance: >3 FB Neck ROM: Full    Dental  (+) Poor Dentition, Missing, Loose, Chipped   Pulmonary    breath sounds clear to auscultation       Cardiovascular + Peripheral Vascular Disease   Rhythm:Regular Rate:Normal     Neuro/Psych CVA, No Residual Symptoms    GI/Hepatic negative GI ROS, Neg liver ROS,,,  Endo/Other  diabetes, Type 2, Oral Hypoglycemic Agents    Renal/GU negative Renal ROS     Musculoskeletal   Abdominal   Peds  Hematology   Anesthesia Other Findings   Reproductive/Obstetrics                              Anesthesia Physical Anesthesia Plan  ASA: 3  Anesthesia Plan: General   Post-op Pain Management: Ofirmev  IV (intra-op)*   Induction: Intravenous  PONV Risk Score and Plan: 3 and Ondansetron , Dexamethasone and Midazolam  Airway Management Planned: Oral ETT  Additional Equipment: Arterial line  Intra-op Plan:   Post-operative Plan: Extubation in OR  Informed Consent: I have reviewed the patients History and Physical, chart, labs and discussed the procedure including the risks, benefits and alternatives for the proposed anesthesia with the patient or authorized representative who has indicated his/her understanding and acceptance.     Dental advisory given  Plan Discussed with: CRNA  Anesthesia Plan Comments: (PAT note written 03/25/2024 by Hilliard Borges, PA-C.  Remi gtt.   )         Anesthesia Quick Evaluation

## 2024-03-25 NOTE — Progress Notes (Signed)
 SDW CALL  Patient was given pre-op instructions over the phone. The opportunity was given for the patient to ask questions. No further questions asked. Patient verbalized understanding of instructions given.   PCP - none Cardiologist - denies  PPM/ICD - denies Device Orders -  Rep Notified -   Chest x-ray -  EKG - 03/18/24 Stress Test - denies ECHO - 03/19/24 Cardiac Cath -   Sleep Study - denies CPAP - no  Fasting Blood Sugar - pt does not check his blood sugar at home Checks Blood Sugar _____ times a day  Blood Thinner Instructions:continue Plavix Aspirin Instructions:continue Aspirin  ERAS Protcol -NPO PRE-SURGERY Ensure or G2-   COVID TEST- na   Anesthesia review: yes-acute ischemic stroke 03/18/24,DM2,chews tobacco  Patient denies shortness of breath, fever, cough and chest pain over the phone call    Special instructions:    Oral Hygiene is also important to reduce your risk of infection.  Remember - BRUSH YOUR TEETH THE MORNING OF SURGERY WITH YOUR REGULAR TOOTHPASTE

## 2024-03-25 NOTE — Progress Notes (Signed)
 Anesthesia Chart Review: SAME DAY WORK-UP  Case: [id number] Date/Time: 03/26/24 1054   Procedure: ENDARTERECTOMY, CAROTID (Left)   Anesthesia type: General   Diagnosis: Stenosis of left carotid artery [I65.22]   Pre-op diagnosis: L Carotid Stenosis   Location: [facility] ROOM 11 / [facility]   Surgeons: Pearline Norman RAMAN, MD       DISCUSSION: Patient is a 53 year old male scheduled for the above procedure. He was admitted to [facility] 03/18/2024 - 03/21/2024 for watershed acute and non-acute infarct. He had an episode of AMS at work then recurrent symptoms at home. He also had an event where his wife had to help him to the ground and appears to be shaking for a few minutes with suspected seizure. EMS called. He is known diabetic, but not on medications due to cost. He was hyperglycemic (glucose > 400). Head CT showed left frontal infarct, possible acute. Neurology consulted. Brain MRI showed widely scatted small acute and nonacute left MCA territory infarcts, confluent petechial hemorrhage at the area of CT abnormality, largely non-acute and with no malignant hemorrhagic transformation or mass effect. 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 EEG showed no seizures. Neurology thought CVA likely from symptomatic left carotid artery disease. Vascular surgeon consulted. Plan for discharge on ASA, Plavix, and statin with out-patient left carotid endarterectomy the following week. Of note, A1c 10.3% and discharged on glipizide  and metformin .  History includes never smoker (uses chewing tobacco), DM2, carotid artery stenosis, CVA  (03/18/2024), left hand cellulitis with Staphylococcus bacteremia (12/2021)   Echo 03/19/2024 showed LVEF 60 to 65%, no regional wall motion abnormalities, mild concentric LVH, normal diastolic parameters, normal RV systolic function, small nodules on the aortic valve cusp are probably prominent Arantius nodules (normal variant), no AS or AR noted.   He will continue ASA and Plavix perioperatively.   He had recent labs and EKG during his CVA admission. For T&S and any necessary updated labs on arrival for surgery.   VS:  Wt Readings from Last 3 Encounters:  03/18/24 72.6 kg  07/16/23 72.6 kg  08/28/22 68.9 kg   BP Readings from Last 3 Encounters:  03/21/24 107/72  07/17/23 114/77  08/28/22 130/76   Pulse Readings from Last 3 Encounters:  03/21/24 77  07/17/23 91  08/28/22 98     PROVIDERS: Patient, No Pcp Per   LABS: Most recent lab results in [facility] include: Lab Results  Component Value Date   WBC 10.8 (H) 03/21/2024   HGB 14.7 03/21/2024   HCT 41.2 03/21/2024   PLT 236 03/21/2024   GLUCOSE 135 (H) 03/21/2024   CHOL 266 (H) 03/19/2024   TRIG 343 (H) 03/19/2024   HDL 51 03/19/2024   LDLCALC 146 (H) 03/19/2024   ALT 16 03/21/2024   AST 22 03/21/2024   NA 136 03/21/2024   K 4.1 03/21/2024   CL 98 03/21/2024   CREATININE 0.82 03/21/2024   BUN 14 03/21/2024   CO2 27 03/21/2024   HGBA1C 10.3 (H) 03/19/2024   EEG 03/19/2024: - This study is within normal limits. No seizures or epileptiform discharges were seen throughout the recording. - A normal interictal  EEG does not exclude the diagnosis of epilepsy.   IMAGES: CTA Head & Neck 03/19/2024: IMPRESSION: 1. Advanced intracranial atherosclerosis (see #4) with no emergent large vessel occlusion. 2. Positive for Bulky low-density plaque at the left carotid bifurcation and ICA origin causing approximately 58% stenosis. 3. Positive also for chronic appearing occlusion of nondominant and diminutive left vertebral  artery V4 segment. 4. 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 Up to Moderate stenoses at the Left MCA origin, proximal left M1 segment, Right MCA trifurcation, and Right PCA P2 segment.   MRI Brain 03/19/2024: IMPRESSION: 1. Widely scattered small acute on nonacute left MCA territory infarcts (largest 8 mm). 2. Confluent petechial hemorrhage at the area of CT abnormality, which appears largely nonacute. No malignant hemorrhagic transformation or mass effect. 3. Negative for age non-contrast MRI appearance of the brain otherwise.  1V PCXR 03/19/2024: FINDINGS: - LUNGS AND PLEURA: Low lung volumes with vascular crowding. Mild bibasilar opacities, likely atelectasis. No pulmonary edema. No pleural effusion. No pneumothorax. - HEART AND MEDIASTINUM: No acute abnormality of the cardiac and mediastinal silhouettes. - BONES AND SOFT TISSUES: No acute osseous abnormality. IMPRESSION: 1. Low lung volumes with bibasilar atelectasis.  CT Abd/pelvis 07/16/2023: IMPRESSION: 1. Moderate fat containing umbilical hernia with infiltration of the fat contained within the hernia sac possibly related to incarceration and resultant edema. 2. Moderate hepatic steatosis. 3. Moderate prostatic hypertrophy.    EKG: 03/18/2024: Sinus rhythm Probable left atrial enlargement Borderline right axis deviation Probable left ventricular hypertrophy No old tracing to compare Confirmed by Raford Lenis ([id number]) on 03/19/2024 3:39:49 AM   CV: US  Carotid 03/20/2024: Summary:  - Right Carotid: Velocities in the right ICA are consistent with a 1-39%  stenosis.  - Left Carotid: Velocities in the left ICA are consistent with a 40-59%  stenosis.  - Vertebrals: Bilateral vertebral arteries demonstrate antegrade flow.   Echo 03/19/2024: IMPRESSIONS   1. Left ventricular ejection fraction, by estimation, is 60 to 65%. The  left ventricle has normal function. The left ventricle has no regional  wall  motion abnormalities. There is mild concentric left ventricular  hypertrophy. Left ventricular diastolic  parameters were normal. The average left ventricular global longitudinal  strain is -18.2 %. The global longitudinal strain is normal.   2. Right ventricular systolic function is normal. The right ventricular  size is normal.   3. The mitral valve is normal in structure. No evidence of mitral valve  regurgitation. No evidence of mitral stenosis.   4. Small nodules on the aortic valve cusps are probably prominent  Arantius nodules (normal variant). The aortic valve is normal in  structure. There is mild thickening of the aortic valve. Aortic valve  regurgitation is not visualized. Aortic valve  sclerosis is present, with no evidence of aortic valve stenosis.   5. The inferior vena cava is normal in size with greater than 50%  respiratory variability, suggesting right atrial pressure of 3 mmHg.  - Conclusion(s)/Recommendation(s): No intracardiac source of embolism  detected on this transthoracic study. Consider a transesophageal  echocardiogram to exclude cardiac source of embolism if clinically  indicated.    Past Medical History:  Diagnosis Date   Carotid artery stenosis    Cellulitis    CVA (cerebral vascular accident) (HCC) 03/18/2024   Diabetes mellitus, type 2 (HCC)     History reviewed. No pertinent surgical history.  MEDICATIONS: No current facility-administered medications for this encounter.    aspirin EC 81 MG tablet   atorvastatin (LIPITOR)  80 MG tablet   blood glucose meter kit and supplies   clopidogrel (PLAVIX) 75 MG tablet   glipiZIDE  (GLUCOTROL ) 5 MG tablet   levETIRAcetam (KEPPRA) 500 MG tablet   metFORMIN  (GLUCOPHAGE ) 1000 MG tablet    Isaiah Ruder, PA-C Surgical Short Stay/Anesthesiology [facility] Phone [phone] [facility] Phone [phone] 03/25/2024 3:18 PM

## 2024-03-26 ENCOUNTER — Inpatient Hospital Stay (HOSPITAL_COMMUNITY)
Admission: RE | Admit: 2024-03-26 | Discharge: 2024-03-28 | DRG: 038 | Disposition: A | Discharge status: Home / Self Care | Payer: Self-pay | Attending: Physician Assistant | Admitting: Physician Assistant

## 2024-03-26 ENCOUNTER — Encounter (HOSPITAL_COMMUNITY)
Admission: RE | Disposition: A | Discharge status: Home / Self Care | Payer: Self-pay | Source: Home / Self Care | Attending: Vascular Surgery

## 2024-03-26 ENCOUNTER — Other Ambulatory Visit: Payer: Self-pay

## 2024-03-26 ENCOUNTER — Encounter (HOSPITAL_COMMUNITY): Payer: Self-pay | Admitting: Vascular Surgery

## 2024-03-26 ENCOUNTER — Inpatient Hospital Stay (HOSPITAL_COMMUNITY): Payer: Self-pay | Admitting: Vascular Surgery

## 2024-03-26 DIAGNOSIS — E1165 Type 2 diabetes mellitus with hyperglycemia: Secondary | ICD-10-CM

## 2024-03-26 DIAGNOSIS — Z7902 Long term (current) use of antithrombotics/antiplatelets: Secondary | ICD-10-CM

## 2024-03-26 DIAGNOSIS — I6522 Occlusion and stenosis of left carotid artery: Secondary | ICD-10-CM

## 2024-03-26 DIAGNOSIS — E860 Dehydration: Secondary | ICD-10-CM | POA: Diagnosis present

## 2024-03-26 DIAGNOSIS — I672 Cerebral atherosclerosis: Secondary | ICD-10-CM | POA: Diagnosis present

## 2024-03-26 DIAGNOSIS — Z87891 Personal history of nicotine dependence: Secondary | ICD-10-CM

## 2024-03-26 DIAGNOSIS — E785 Hyperlipidemia, unspecified: Secondary | ICD-10-CM | POA: Diagnosis present

## 2024-03-26 DIAGNOSIS — Z7984 Long term (current) use of oral hypoglycemic drugs: Secondary | ICD-10-CM

## 2024-03-26 DIAGNOSIS — E872 Acidosis, unspecified: Secondary | ICD-10-CM | POA: Diagnosis present

## 2024-03-26 DIAGNOSIS — E871 Hypo-osmolality and hyponatremia: Secondary | ICD-10-CM | POA: Diagnosis present

## 2024-03-26 DIAGNOSIS — Z79899 Other long term (current) drug therapy: Secondary | ICD-10-CM

## 2024-03-26 DIAGNOSIS — Z8673 Personal history of transient ischemic attack (TIA), and cerebral infarction without residual deficits: Secondary | ICD-10-CM

## 2024-03-26 DIAGNOSIS — G40909 Epilepsy, unspecified, not intractable, without status epilepticus: Secondary | ICD-10-CM | POA: Diagnosis present

## 2024-03-26 DIAGNOSIS — I9581 Postprocedural hypotension: Secondary | ICD-10-CM | POA: Diagnosis not present

## 2024-03-26 DIAGNOSIS — I639 Cerebral infarction, unspecified: Secondary | ICD-10-CM

## 2024-03-26 DIAGNOSIS — Z7982 Long term (current) use of aspirin: Secondary | ICD-10-CM

## 2024-03-26 DIAGNOSIS — E876 Hypokalemia: Secondary | ICD-10-CM | POA: Diagnosis present

## 2024-03-26 HISTORY — DX: Occlusion and stenosis of unspecified carotid artery: I65.29

## 2024-03-26 HISTORY — PX: ENDARTERECTOMY: SHX5162

## 2024-03-26 LAB — COMPREHENSIVE METABOLIC PANEL WITH GFR
ALT: 31 U/L (ref 0–44)
ALT: 27 U/L (ref 0–44)
AST: 31 U/L (ref 15–41)
AST: 26 U/L (ref 15–41)
Albumin: 3.2 g/dL — ABNORMAL LOW (ref 3.5–5.0)
Albumin: 2.7 g/dL — ABNORMAL LOW (ref 3.5–5.0)
Alkaline Phosphatase: 146 U/L — ABNORMAL HIGH (ref 38–126)
Alkaline Phosphatase: 124 U/L (ref 38–126)
Anion gap: 11 (ref 5–15)
Anion gap: 14 (ref 5–15)
BUN: 11 mg/dL (ref 6–20)
BUN: 11 mg/dL (ref 6–20)
CO2: 18 mmol/L — ABNORMAL LOW (ref 22–32)
CO2: 17 mmol/L — ABNORMAL LOW (ref 22–32)
Calcium: 8.2 mg/dL — ABNORMAL LOW (ref 8.9–10.3)
Calcium: 7.9 mg/dL — ABNORMAL LOW (ref 8.9–10.3)
Chloride: 96 mmol/L — ABNORMAL LOW (ref 98–111)
Chloride: 97 mmol/L — ABNORMAL LOW (ref 98–111)
Creatinine, Ser: 0.59 mg/dL — ABNORMAL LOW (ref 0.61–1.24)
Creatinine, Ser: 0.77 mg/dL (ref 0.61–1.24)
GFR, Estimated: >60 mL/min (ref >60)
GFR, Estimated: >60 mL/min (ref >60)
Glucose, Bld: 172 mg/dL — ABNORMAL HIGH (ref 70–99)
Glucose, Bld: 203 mg/dL — ABNORMAL HIGH (ref 70–99)
Potassium: 3.4 mmol/L — ABNORMAL LOW (ref 3.5–5.1)
Potassium: 3.6 mmol/L (ref 3.5–5.1)
Sodium: 125 mmol/L — ABNORMAL LOW (ref 135–145)
Sodium: 128 mmol/L — ABNORMAL LOW (ref 135–145)
Total Bilirubin: 0.9 mg/dL (ref 0.0–1.2)
Total Bilirubin: 1 mg/dL (ref 0.0–1.2)
Total Protein: 7 g/dL (ref 6.5–8.1)
Total Protein: 5.8 g/dL — ABNORMAL LOW (ref 6.5–8.1)

## 2024-03-26 LAB — URINALYSIS, ROUTINE W REFLEX MICROSCOPIC
Bilirubin Urine: NEGATIVE
Glucose, UA: 50 mg/dL — AB
Hgb urine dipstick: NEGATIVE
Ketones, ur: NEGATIVE mg/dL
Leukocytes,Ua: NEGATIVE
Nitrite: NEGATIVE
Protein, ur: 100 mg/dL — AB
Specific Gravity, Urine: 1.009 (ref 1.005–1.030)
pH: 6 (ref 5.0–8.0)

## 2024-03-26 LAB — GLUCOSE, CAPILLARY
Glucose-Capillary: 176 mg/dL — ABNORMAL HIGH (ref 70–99)
Glucose-Capillary: 133 mg/dL — ABNORMAL HIGH (ref 70–99)
Glucose-Capillary: 211 mg/dL — ABNORMAL HIGH (ref 70–99)
Glucose-Capillary: 175 mg/dL — ABNORMAL HIGH (ref 70–99)

## 2024-03-26 LAB — CBC
HCT: 41 % (ref 39.0–52.0)
Hemoglobin: 15 g/dL (ref 13.0–17.0)
MCH: 31.1 pg (ref 26.0–34.0)
MCHC: 36.6 g/dL — ABNORMAL HIGH (ref 30.0–36.0)
MCV: 84.9 fL (ref 80.0–100.0)
Platelets: 224 K/uL (ref 150–400)
RBC: 4.83 MIL/uL (ref 4.22–5.81)
RDW: 11.6 % (ref 11.5–15.5)
WBC: 10.3 K/uL (ref 4.0–10.5)
nRBC: 0 % (ref 0.0–0.2)

## 2024-03-26 LAB — TYPE AND SCREEN
ABO/RH(D): B POS
Antibody Screen: NEGATIVE

## 2024-03-26 LAB — MAGNESIUM: Magnesium: 1.4 mg/dL — ABNORMAL LOW (ref 1.7–2.4)

## 2024-03-26 LAB — PROTIME-INR
INR: 1 (ref 0.8–1.2)
Prothrombin Time: 13.9 s (ref 11.4–15.2)

## 2024-03-26 LAB — SURGICAL PCR SCREEN
MRSA, PCR: NEGATIVE
Staphylococcus aureus: POSITIVE — AB

## 2024-03-26 LAB — BETA-HYDROXYBUTYRIC ACID: Beta-Hydroxybutyric Acid: 1.96 mmol/L — ABNORMAL HIGH (ref 0.05–0.27)

## 2024-03-26 LAB — APTT: aPTT: 31 s (ref 24–36)

## 2024-03-26 LAB — LACTIC ACID, PLASMA: Lactic Acid, Venous: 0.9 mmol/L (ref 0.5–1.9)

## 2024-03-26 LAB — ABO/RH: ABO/RH(D): B POS

## 2024-03-26 SURGERY — ENDARTERECTOMY, CAROTID
Anesthesia: General | Site: Neck | Laterality: Left

## 2024-03-26 MED ORDER — PHENOL 1.4 % MT LIQD
1.0000 | OROMUCOSAL | Status: DC | PRN
Start: 2024-03-26 — End: 2024-03-28

## 2024-03-26 MED ORDER — PROPOFOL 10 MG/ML IV BOLUS
INTRAVENOUS | Status: DC | PRN
  Administered 2024-03-26: 140 mg via INTRAVENOUS
  Administered 2024-03-26: 30 mg via INTRAVENOUS

## 2024-03-26 MED ORDER — LACTATED RINGERS IV SOLN: INTRAVENOUS | Status: DC | PRN

## 2024-03-26 MED ORDER — ACETAMINOPHEN 10 MG/ML IV SOLN
INTRAVENOUS | Status: DC | PRN
  Administered 2024-03-26: 1000 mg via INTRAVENOUS

## 2024-03-26 MED ORDER — MUPIROCIN 2 % EX OINT
1.0000 | TOPICAL_OINTMENT | Freq: Two times a day (BID) | CUTANEOUS | Status: DC
  Administered 2024-03-27 – 2024-03-28 (×3): 1 via NASAL
  Filled 2024-03-26: qty 22

## 2024-03-26 MED ORDER — OXYCODONE-ACETAMINOPHEN 5-325 MG PO TABS: 1.0000 | ORAL_TABLET | ORAL | Status: DC | PRN

## 2024-03-26 MED ORDER — SODIUM CHLORIDE 0.9 % IV SOLN: INTRAVENOUS | Status: DC

## 2024-03-26 MED ORDER — DROPERIDOL 2.5 MG/ML IJ SOLN: 0.6250 mg | Freq: Once | INTRAMUSCULAR | Status: DC | PRN

## 2024-03-26 MED ORDER — 0.9 % SODIUM CHLORIDE (POUR BTL) OPTIME
TOPICAL | Status: DC | PRN
  Administered 2024-03-26: 2000 mL

## 2024-03-26 MED ORDER — LACTATED RINGERS IV SOLN: Freq: Once | INTRAVENOUS | Status: AC

## 2024-03-26 MED ORDER — LIDOCAINE 2% (20 MG/ML) 5 ML SYRINGE
INTRAMUSCULAR | Status: AC
  Filled 2024-03-26: qty 5

## 2024-03-26 MED ORDER — LIDOCAINE HCL (PF) 1 % IJ SOLN
INTRAMUSCULAR | Status: AC
  Filled 2024-03-26: qty 5

## 2024-03-26 MED ORDER — INSULIN ASPART 100 UNIT/ML IJ SOLN
0.0000 [IU] | INTRAMUSCULAR | Status: DC | PRN
  Administered 2024-03-26: 2 [IU] via SUBCUTANEOUS

## 2024-03-26 MED ORDER — HYDRALAZINE HCL 20 MG/ML IJ SOLN: 5.0000 mg | INTRAMUSCULAR | Status: DC | PRN

## 2024-03-26 MED ORDER — LABETALOL HCL 5 MG/ML IV SOLN: 10.0000 mg | INTRAVENOUS | Status: DC | PRN

## 2024-03-26 MED ORDER — METFORMIN HCL 500 MG PO TABS
1000.0000 mg | ORAL_TABLET | Freq: Two times a day (BID) | ORAL | Status: DC
  Administered 2024-03-27: 1000 mg via ORAL
  Filled 2024-03-26: qty 2

## 2024-03-26 MED ORDER — HEMOSTATIC AGENTS (NO CHARGE) OPTIME
TOPICAL | Status: DC | PRN
  Administered 2024-03-26 (×2): 1 via TOPICAL

## 2024-03-26 MED ORDER — HEPARIN 6000 UNIT IRRIGATION SOLUTION
Status: DC | PRN
  Administered 2024-03-26: 1

## 2024-03-26 MED ORDER — FENTANYL CITRATE (PF) 100 MCG/2ML IJ SOLN: 25.0000 ug | INTRAMUSCULAR | Status: DC | PRN

## 2024-03-26 MED ORDER — ACETAMINOPHEN 10 MG/ML IV SOLN
INTRAVENOUS | Status: AC
  Filled 2024-03-26: qty 100

## 2024-03-26 MED ORDER — FENTANYL CITRATE (PF) 250 MCG/5ML IJ SOLN
INTRAMUSCULAR | Status: DC | PRN
  Administered 2024-03-26 (×2): 50 ug via INTRAVENOUS
  Administered 2024-03-26: 100 ug via INTRAVENOUS

## 2024-03-26 MED ORDER — ACETAMINOPHEN 650 MG RE SUPP: 325.0000 mg | RECTAL | Status: DC | PRN

## 2024-03-26 MED ORDER — HYDROMORPHONE HCL 1 MG/ML IJ SOLN: 0.5000 mg | INTRAMUSCULAR | Status: DC | PRN

## 2024-03-26 MED ORDER — FENTANYL CITRATE (PF) 250 MCG/5ML IJ SOLN
INTRAMUSCULAR | Status: AC
  Filled 2024-03-26: qty 5

## 2024-03-26 MED ORDER — ONDANSETRON HCL 4 MG/2ML IJ SOLN
INTRAMUSCULAR | Status: AC
  Filled 2024-03-26: qty 2

## 2024-03-26 MED ORDER — LEVETIRACETAM 500 MG PO TABS
500.0000 mg | ORAL_TABLET | Freq: Two times a day (BID) | ORAL | Status: DC
  Administered 2024-03-26 – 2024-03-28 (×4): 500 mg via ORAL
  Filled 2024-03-26 (×4): qty 1

## 2024-03-26 MED ORDER — LACTATED RINGERS IV SOLN: INTRAVENOUS | Status: DC

## 2024-03-26 MED ORDER — ATORVASTATIN CALCIUM 80 MG PO TABS
80.0000 mg | ORAL_TABLET | Freq: Every day | ORAL | Status: DC
  Administered 2024-03-26 – 2024-03-28 (×3): 80 mg via ORAL
  Filled 2024-03-26 (×3): qty 1

## 2024-03-26 MED ORDER — SODIUM CHLORIDE 0.9 % IV BOLUS
1000.0000 mL | Freq: Once | INTRAVENOUS | Status: AC
  Administered 2024-03-26: 1000 mL via INTRAVENOUS

## 2024-03-26 MED ORDER — CHLORHEXIDINE GLUCONATE 0.12 % MT SOLN
15.0000 mL | Freq: Once | OROMUCOSAL | Status: AC
  Administered 2024-03-26: 15 mL via OROMUCOSAL
  Filled 2024-03-26: qty 15

## 2024-03-26 MED ORDER — PROTAMINE SULFATE 10 MG/ML IV SOLN
INTRAVENOUS | Status: DC | PRN
  Administered 2024-03-26: 30 mg via INTRAVENOUS

## 2024-03-26 MED ORDER — METOPROLOL TARTRATE 5 MG/5ML IV SOLN: 2.5000 mg | INTRAVENOUS | Status: DC | PRN

## 2024-03-26 MED ORDER — OXYCODONE HCL 5 MG PO TABS: 5.0000 mg | ORAL_TABLET | Freq: Once | ORAL | Status: DC | PRN

## 2024-03-26 MED ORDER — INSULIN ASPART 100 UNIT/ML IJ SOLN
0.0000 [IU] | Freq: Three times a day (TID) | INTRAMUSCULAR | Status: DC
  Administered 2024-03-27: 2 [IU] via SUBCUTANEOUS
  Filled 2024-03-26: qty 2

## 2024-03-26 MED ORDER — ACETAMINOPHEN 10 MG/ML IV SOLN: 1000.0000 mg | Freq: Once | INTRAVENOUS | Status: DC | PRN

## 2024-03-26 MED ORDER — HEPARIN 6000 UNIT IRRIGATION SOLUTION
Status: AC
  Filled 2024-03-26: qty 500

## 2024-03-26 MED ORDER — POLYETHYLENE GLYCOL 3350 17 G PO PACK
17.0000 g | PACK | Freq: Every day | ORAL | Status: DC | PRN
Start: 2024-03-26 — End: 2024-03-28

## 2024-03-26 MED ORDER — ONDANSETRON HCL 4 MG/2ML IJ SOLN
INTRAMUSCULAR | Status: DC | PRN
Start: 2024-03-26 — End: 2024-03-26
  Administered 2024-03-26: 4 mg via INTRAVENOUS

## 2024-03-26 MED ORDER — PROPOFOL 10 MG/ML IV BOLUS
INTRAVENOUS | Status: AC
  Filled 2024-03-26: qty 20

## 2024-03-26 MED ORDER — MIDAZOLAM HCL 2 MG/2ML IJ SOLN
INTRAMUSCULAR | Status: AC
  Filled 2024-03-26: qty 2

## 2024-03-26 MED ORDER — EPHEDRINE SULFATE-NACL 50-0.9 MG/10ML-% IV SOSY
PREFILLED_SYRINGE | INTRAVENOUS | Status: DC | PRN
  Administered 2024-03-26 (×3): 5 mg via INTRAVENOUS

## 2024-03-26 MED ORDER — BISACODYL 10 MG RE SUPP: 10.0000 mg | Freq: Every day | RECTAL | Status: DC | PRN

## 2024-03-26 MED ORDER — CHLORHEXIDINE GLUCONATE CLOTH 2 % EX PADS: 6.0000 | MEDICATED_PAD | Freq: Once | CUTANEOUS | Status: DC

## 2024-03-26 MED ORDER — CHLORHEXIDINE GLUCONATE CLOTH 2 % EX PADS
6.0000 | MEDICATED_PAD | Freq: Every day | CUTANEOUS | Status: DC
  Administered 2024-03-27 – 2024-03-28 (×2): 6 via TOPICAL

## 2024-03-26 MED ORDER — HEPARIN SODIUM (PORCINE) 1000 UNIT/ML IJ SOLN
INTRAMUSCULAR | Status: DC | PRN
  Administered 2024-03-26: 7000 [IU] via INTRAVENOUS

## 2024-03-26 MED ORDER — MIDAZOLAM HCL (PF) 2 MG/2ML IJ SOLN
INTRAMUSCULAR | Status: DC | PRN
Start: 2024-03-26 — End: 2024-03-26
  Administered 2024-03-26: 2 mg via INTRAVENOUS

## 2024-03-26 MED ORDER — ALBUMIN HUMAN 5 % IV SOLN
12.5000 g | Freq: Once | INTRAVENOUS | Status: AC
  Administered 2024-03-26: 12.5 g via INTRAVENOUS

## 2024-03-26 MED ORDER — OXYCODONE HCL 5 MG/5ML PO SOLN: 5.0000 mg | Freq: Once | ORAL | Status: DC | PRN

## 2024-03-26 MED ORDER — ROCURONIUM BROMIDE 10 MG/ML (PF) SYRINGE
PREFILLED_SYRINGE | INTRAVENOUS | Status: AC
  Filled 2024-03-26: qty 10

## 2024-03-26 MED ORDER — ROCURONIUM BROMIDE 10 MG/ML (PF) SYRINGE
PREFILLED_SYRINGE | INTRAVENOUS | Status: DC | PRN
  Administered 2024-03-26: 20 mg via INTRAVENOUS
  Administered 2024-03-26: 50 mg via INTRAVENOUS
  Administered 2024-03-26: 20 mg via INTRAVENOUS
  Administered 2024-03-26: 30 mg via INTRAVENOUS
  Administered 2024-03-26: 20 mg via INTRAVENOUS

## 2024-03-26 MED ORDER — ALBUMIN HUMAN 5 % IV SOLN
INTRAVENOUS | Status: AC
  Filled 2024-03-26: qty 250

## 2024-03-26 MED ORDER — CEFAZOLIN SODIUM-DEXTROSE 2-4 GM/100ML-% IV SOLN
2.0000 g | Freq: Three times a day (TID) | INTRAVENOUS | Status: AC
  Administered 2024-03-26 – 2024-03-27 (×2): 2 g via INTRAVENOUS
  Filled 2024-03-26 (×2): qty 100

## 2024-03-26 MED ORDER — CLOPIDOGREL BISULFATE 75 MG PO TABS
75.0000 mg | ORAL_TABLET | Freq: Every day | ORAL | Status: DC
  Administered 2024-03-27 – 2024-03-28 (×2): 75 mg via ORAL
  Filled 2024-03-26 (×2): qty 1

## 2024-03-26 MED ORDER — PHENYLEPHRINE HCL-NACL 20-0.9 MG/250ML-% IV SOLN
INTRAVENOUS | Status: DC | PRN
  Administered 2024-03-26: 40 ug/min via INTRAVENOUS

## 2024-03-26 MED ORDER — CEFAZOLIN SODIUM-DEXTROSE 2-4 GM/100ML-% IV SOLN
2.0000 g | INTRAVENOUS | Status: AC
  Administered 2024-03-26: 2 g via INTRAVENOUS
  Filled 2024-03-26: qty 100

## 2024-03-26 MED ORDER — ASPIRIN 81 MG PO TBEC
81.0000 mg | DELAYED_RELEASE_TABLET | Freq: Every day | ORAL | Status: DC
  Administered 2024-03-26 – 2024-03-28 (×3): 81 mg via ORAL
  Filled 2024-03-26 (×3): qty 1

## 2024-03-26 MED ORDER — ONDANSETRON HCL 4 MG/2ML IJ SOLN: 4.0000 mg | Freq: Four times a day (QID) | INTRAMUSCULAR | Status: DC | PRN

## 2024-03-26 MED ORDER — DEXAMETHASONE SOD PHOSPHATE PF 10 MG/ML IJ SOLN
INTRAMUSCULAR | Status: DC | PRN
Start: 2024-03-26 — End: 2024-03-26
  Administered 2024-03-26: 10 mg via INTRAVENOUS

## 2024-03-26 MED ORDER — ORAL CARE MOUTH RINSE: 15.0000 mL | Freq: Once | OROMUCOSAL | Status: AC

## 2024-03-26 MED ORDER — POTASSIUM CHLORIDE CRYS ER 20 MEQ PO TBCR
40.0000 meq | EXTENDED_RELEASE_TABLET | Freq: Every day | ORAL | Status: DC | PRN
  Administered 2024-03-28: 60 meq via ORAL
  Filled 2024-03-26: qty 3

## 2024-03-26 MED ORDER — DOCUSATE SODIUM 100 MG PO CAPS
100.0000 mg | ORAL_CAPSULE | Freq: Every day | ORAL | Status: DC
  Administered 2024-03-27: 100 mg via ORAL
  Filled 2024-03-26 (×3): qty 1

## 2024-03-26 MED ORDER — SODIUM CHLORIDE 0.9 % IV SOLN: 500.0000 mL | Freq: Once | INTRAVENOUS | Status: DC | PRN

## 2024-03-26 MED ORDER — SUGAMMADEX SODIUM 200 MG/2ML IV SOLN
INTRAVENOUS | Status: DC | PRN
  Administered 2024-03-26: 200 mg via INTRAVENOUS

## 2024-03-26 MED ORDER — INSULIN ASPART 100 UNIT/ML IJ SOLN
0.0000 [IU] | INTRAMUSCULAR | Status: AC
  Administered 2024-03-26: 3 [IU] via SUBCUTANEOUS
  Filled 2024-03-26: qty 3

## 2024-03-26 MED ORDER — HEMOSTATIC AGENTS (NO CHARGE) OPTIME
TOPICAL | Status: DC | PRN
  Administered 2024-03-26: 1 via TOPICAL

## 2024-03-26 MED ORDER — ACETAMINOPHEN 325 MG PO TABS: 325.0000 mg | ORAL_TABLET | ORAL | Status: DC | PRN

## 2024-03-26 MED ORDER — LIDOCAINE 2% (20 MG/ML) 5 ML SYRINGE
INTRAMUSCULAR | Status: DC | PRN
  Administered 2024-03-26: 40 mg via INTRAVENOUS

## 2024-03-26 MED ORDER — GLIPIZIDE 5 MG PO TABS
5.0000 mg | ORAL_TABLET | Freq: Two times a day (BID) | ORAL | Status: DC
  Administered 2024-03-27 – 2024-03-28 (×2): 5 mg via ORAL
  Filled 2024-03-26 (×3): qty 1

## 2024-03-26 SURGICAL SUPPLY — 37 items
BAG COUNTER SPONGE SURGICOUNT (BAG) ×1 IMPLANT
CANISTER SUCTION 3000ML PPV (SUCTIONS) ×1 IMPLANT
CATH ROBINSON RED A/P 18FR (CATHETERS) ×1 IMPLANT
CLIP TI MEDIUM 24 (CLIP) ×1 IMPLANT
CLIP TI MEDIUM 6 (CLIP) IMPLANT
CLIP TI WIDE RED SMALL 24 (CLIP) ×1 IMPLANT
CLIP TI WIDE RED SMALL 6 (CLIP) IMPLANT
COVER SURGIBOOT TRANSDUCER 6 (DISPOSABLE) ×1 IMPLANT
DERMABOND ADVANCED .7 DNX12 (GAUZE/BANDAGES/DRESSINGS) ×1 IMPLANT
ELECTRODE REM PT RTRN 9FT ADLT (ELECTROSURGICAL) ×1 IMPLANT
GLOVE BIOGEL PI IND STRL 7.0 (GLOVE) ×1 IMPLANT
GOWN STRL REUS W/ TWL LRG LVL3 (GOWN DISPOSABLE) ×2 IMPLANT
GOWN STRL REUS W/ TWL XL LVL3 (GOWN DISPOSABLE) ×1 IMPLANT
HEMOSTAT SNOW SURGICEL 2X4 (HEMOSTASIS) IMPLANT
IV ADAPTER SYR DOUBLE MALE LL (MISCELLANEOUS) IMPLANT
KIT BASIN OR (CUSTOM PROCEDURE TRAY) ×1 IMPLANT
KIT SHUNT ARGYLE CAROTID ART 6 (VASCULAR PRODUCTS) ×1 IMPLANT
KIT TURNOVER KIT B (KITS) ×1 IMPLANT
LOOP VESSEL MINI RED (MISCELLANEOUS) IMPLANT
NDL HYPO 25GX1X1/2 BEV (NEEDLE) IMPLANT
NEEDLE HYPO 25GX1X1/2 BEV (NEEDLE) IMPLANT
PACK CAROTID (CUSTOM PROCEDURE TRAY) ×1 IMPLANT
PAD ARMBOARD POSITIONER FOAM (MISCELLANEOUS) ×2 IMPLANT
PATCH VASC XENOSURE 1X6 (Vascular Products) IMPLANT
POSITIONER HEAD DONUT 9IN (MISCELLANEOUS) ×1 IMPLANT
POWDER SURGICEL 3.0 GRAM (HEMOSTASIS) IMPLANT
SOLN 0.9% NACL POUR BTL 1000ML (IV SOLUTION) ×3 IMPLANT
SOLN STERILE WATER BTL 1000 ML (IV SOLUTION) ×1 IMPLANT
SUT ETHILON 3 0 PS 1 (SUTURE) IMPLANT
SUT MNCRL AB 4-0 PS2 18 (SUTURE) ×1 IMPLANT
SUT PROLENE 6 0 BV (SUTURE) ×2 IMPLANT
SUT PROLENE BLUE 7 0 (SUTURE) ×1 IMPLANT
SUT SILK 3-0 18XBRD TIE 12 (SUTURE) IMPLANT
SUT VIC AB 2-0 CT1 TAPERPNT 27 (SUTURE) ×1 IMPLANT
SUT VIC AB 3-0 SH 27X BRD (SUTURE) ×1 IMPLANT
SYR CONTROL 10ML LL (SYRINGE) IMPLANT
TOWEL GREEN STERILE (TOWEL DISPOSABLE) ×1 IMPLANT

## 2024-03-26 NOTE — Transfer of Care (Signed)
 Immediate Anesthesia Transfer of Care Note  Patient: Bryan Mueller  Procedure(s) Performed: ENDARTERECTOMY, LEFT CAROTID wit PATCH ANGIOPLASTY (Left: Neck)  Patient Location: PACU  Anesthesia Type:General  Level of Consciousness: awake and alert   Airway & Oxygen Therapy: Patient Spontanous Breathing and Patient connected to face mask oxygen  Post-op Assessment: Report given to RN, Post -op Vital signs reviewed and stable, Patient moving all extremities X 4, and Patient able to stick tongue midline  Post vital signs: Reviewed and stable, PHENYLEPHRINE GTT RESTARTED FOR ATERIAL PRESSURE 90S SYSTOLIC  Last Vitals:  Vitals Value Taken Time  BP 77/54 03/26/24 15:05  Temp    Pulse 73 03/26/24 15:09  Resp 11 03/26/24 15:09  SpO2 99 % 03/26/24 15:09  Vitals shown include unfiled device data.  Last Pain:  Vitals:   03/26/24 0907  TempSrc:   PainSc: 0-No pain         Complications: No notable events documented.

## 2024-03-26 NOTE — Anesthesia Procedure Notes (Addendum)
 Arterial Line Insertion Start/End11/18/2025 8:55 AM, 03/26/2024 9:00 AM Performed by: Zelphia Norleen HERO, CRNA  Patient location: Pre-op. Preanesthetic checklist: patient identified, IV checked, site marked, risks and benefits discussed, surgical consent, monitors and equipment checked, pre-op evaluation, timeout performed and anesthesia consent Lidocaine  1% used for infiltration Left, radial was placed Catheter size: 20 G Hand hygiene performed  and maximum sterile barriers used   Attempts: 1 Procedure performed using ultrasound to evaluate access site. Following insertion, dressing applied and Biopatch. Post procedure assessment: normal and unchanged  Patient tolerated the procedure well with no immediate complications.

## 2024-03-26 NOTE — Interval H&P Note (Signed)
 History and Physical Interval Note:  03/26/2024 11:25 AM  Bryan Mueller  has presented today for surgery, with the diagnosis of L Carotid Stenosis.  The various methods of treatment have been discussed with the patient and family. After consideration of risks, benefits and other options for treatment, the patient has consented to  Procedure(s): ENDARTERECTOMY, CAROTID (Left) as a surgical intervention.  The patient's history has been reviewed, patient examined, no change in status, stable for surgery.  I have reviewed the patient's chart and labs.  Questions were answered to the patient's satisfaction.     Norman GORMAN Serve

## 2024-03-26 NOTE — Plan of Care (Signed)
  Problem: Education: Goal: Knowledge of General Education information will improve Description: Including pain rating scale, medication(s)/side effects and non-pharmacologic comfort measures Outcome: Progressing   Problem: Health Behavior/Discharge Planning: Goal: Ability to manage health-related needs will improve Outcome: Progressing   Problem: Clinical Measurements: Goal: Ability to maintain clinical measurements within normal limits will improve Outcome: Progressing Goal: Will remain free from infection Outcome: Progressing Goal: Diagnostic test results will improve Outcome: Progressing Goal: Respiratory complications will improve Outcome: Progressing Goal: Cardiovascular complication will be avoided Outcome: Progressing   Problem: Activity: Goal: Risk for activity intolerance will decrease Outcome: Progressing   Problem: Nutrition: Goal: Adequate nutrition will be maintained Outcome: Progressing   Problem: Coping: Goal: Level of anxiety will decrease Outcome: Progressing   Problem: Elimination: Goal: Will not experience complications related to bowel motility Outcome: Progressing Goal: Will not experience complications related to urinary retention Outcome: Progressing   Problem: Pain Managment: Goal: General experience of comfort will improve and/or be controlled Outcome: Progressing   Problem: Safety: Goal: Ability to remain free from injury will improve Outcome: Progressing   Problem: Skin Integrity: Goal: Risk for impaired skin integrity will decrease Outcome: Progressing   Problem: Education: Goal: Ability to describe self-care measures that may prevent or decrease complications (Diabetes Survival Skills Education) will improve Outcome: Progressing Goal: Individualized Educational Video(s) Outcome: Progressing   Problem: Coping: Goal: Ability to adjust to condition or change in health will improve Outcome: Progressing   Problem: Fluid  Volume: Goal: Ability to maintain a balanced intake and output will improve Outcome: Progressing   Problem: Health Behavior/Discharge Planning: Goal: Ability to identify and utilize available resources and services will improve Outcome: Progressing Goal: Ability to manage health-related needs will improve Outcome: Progressing   Problem: Metabolic: Goal: Ability to maintain appropriate glucose levels will improve Outcome: Progressing   Problem: Nutritional: Goal: Maintenance of adequate nutrition will improve Outcome: Progressing Goal: Progress toward achieving an optimal weight will improve Outcome: Progressing   Problem: Skin Integrity: Goal: Risk for impaired skin integrity will decrease Outcome: Progressing   Problem: Tissue Perfusion: Goal: Adequacy of tissue perfusion will improve Outcome: Progressing   Problem: Education: Goal: Knowledge of discharge needs will improve Outcome: Progressing   Problem: Clinical Measurements: Goal: Postoperative complications will be avoided or minimized Outcome: Progressing   Problem: Respiratory: Goal: Will achieve and/or maintain a regular respiratory rate, without signs or symptoms of dyspnea Outcome: Progressing   Problem: Skin Integrity: Goal: Demonstration of wound healing without infection will improve Outcome: Progressing

## 2024-03-26 NOTE — Discharge Instructions (Signed)

## 2024-03-26 NOTE — Op Note (Signed)
 OPERATIVE NOTE  PROCEDURE:   1.  left carotid endarterectomy with bovine patch angioplasty 2.  left intraoperative carotid ultrasound  PRE-OPERATIVE DIAGNOSIS: left symptomatic carotid stenosis  POST-OPERATIVE DIAGNOSIS: same as above   SURGEON: Norman GORMAN Serve MD  ASSISTANT(S): Lucie Apt, PA  Given the complexity of the case,  the assistant was necessary in order to expedient the procedure and safely perform the technical aspects of the operation.  The assistant provided traction and countertraction to assist with exposure of the common carotid artery, external carotid artery, and internal carotid artery.  They also assisted with suture ligatures and dividing the facial vein and multiple small venous branches tethering the hypoglossal nerve. The assistant also played a critical role in placing the shunt safely.  In addition they were necessary to provide adequate traction and countertraction to perform a precise endarterectomy and precise closure.  These skills, especially following the Prolene suture for the anastomosis, could not have been adequately performed by a scrub tech assistant.   ANESTHESIA: general  ESTIMATED BLOOD LOSS: 50 cc  FINDING(S): Continuous Doppler audible flow signatures are appropriate for each carotid artery No evidence of intimal flap visualized on transverse or longitudinal ultrasonography Ruptured carotid plaque at the bifurcation  SPECIMEN(S):  none  INDICATIONS:   Bryan Mueller is a 53 y.o. male who presents with left symptomatic carotid stenosis 60%.  I discussed with the patient the risks, benefits, and alternatives to carotid endarterectomy.  I discussed the procedural details of carotid endarterectomy with the patient.  The patient is aware that the risks of carotid endarterectomy include but are not limited to: bleeding, infection, stroke, myocardial infarction, death, cranial nerve injuries both temporary and permanent, neck hematoma,  possible airway compromise, labile blood pressure post-operatively, cerebral hyperperfusion syndrome, and possible need for additional interventions in the future. The patient is aware of the risks and agrees to proceed forward with the procedure.  DESCRIPTION: After full informed written consent was obtained from the patient, the patient was brought back to the operating room and placed supine upon the operating table.  Prior to induction, the patient received IV antibiotics.  After obtaining adequate anesthesia, the patient was placed into semi-Fowler position with a shoulder roll in place and the patient's neck slightly hyperextended and rotated away from the surgical site.  The patient was prepped in the standard fashion for a left carotid endarterectomy. The left carotid artery and bifurcation were mapped with a sterile ultrasound and marked. I made an incision anterior to the sternocleidomastoid muscle and dissected down through the subcutaneous tissue.  The platysma was opened with electrocautery.  Then I dissected down to the internal jugular vein.  This was dissected medially and posteriorly until I obtained visualization of the common carotid artery.  The common carotid artery was dissected out and then an umbilical tape was placed around the common carotid artery and I loosely applied a Rumel tourniquet.  I then dissected in a periadventitial fashion along the common carotid artery up to the bifurcation. I identified the facial vein, this was ligated and then transected.  I then identified the external carotid artery and the superior thyroid artery.  A 2-0 silk tie was looped around the superior thyroid artery, and a vessel loop around the external carotid. At this point, we gave the patient a therapeutic bolus of Heparin  intravenously (roughly 80 units/kg) and ACT's were monitored and heparin  re-dosed for a goal ACT of 250. I then continued the dissection distally on to the  internal carotid artery. In  the process of this dissection, the hypoglossal nerve was identified and protected throughout the case.  I then dissected out the internal carotid artery until I identified an area of soft tissue in the internal carotid artery.  I dissected slightly distal to this area, and placed a vessel loop that was kept loose.  I then prepared a 10 argyle shunt with heparinized flush and a silk tie at the center in order to be ready to shunt if needed.  I clamped the internal carotid artery, external carotid artery and then the common carotid artery.  I then made an arteriotomy in the common carotid artery with a 11 blade, and extended the arteriotomy with a Potts scissor down into the common carotid artery, then I carried the arteriotomy through the bifurcation into the internal carotid artery until I reached an area that was not diseased.  I briefly unclamped the ICA which demonstrated excellent back bleeding and I decided to proceed without shunting. At this point, I started the endarterectomy in the common carotid artery with a Cytogeneticist and carried this down into the common carotid artery circumferentially.  Then I transected the plaque at a segment where it was adherent.  I then carried the endarterectomy up into the external carotid artery.  The plaque was extracted by unclamping the external carotid artery and everting the artery.  The endarterectomy was then carried into the internal carotid artery, extracting the remaining portion of the carotid plaque which feathered nicely at the distal endpoint.  I passed the plaque off the field as a specimen.    I then spent the next 30 minutes removing intimal flaps and loose debris.  Eventually I reached the point where the residual plaque was densely adherent and any further dissection would compromise the integrity of the wall.  After verifying that there was no more loose intimal flaps or debris, I re-interrogated the entirety of this carotid artery.  At this  point, I was satisfied that the minimal remaining disease was densely adherent to the wall and wall integrity was intact.  I then fashioned a bovine pericardial patch for the geometry of this artery and sewed it in place with a running stitch of 6-0 Prolene.  Prior to completing this patch angioplasty, I allowed all vessels to back bleed and then copoiously irrigated the endarterectomy site with heparinized saline. I then completed the patch angioplasty in the usual fashion.  First, I released the clamp on the external carotid artery, then I released the common carotid artery.  After waiting about 10 heart beats, I released the clamp on the internal carotid artery.  I then interrogated the arterial flow with the continuous Doppler.  The audible waveforms in each artery were consistent with the expected characteristics for each artery.  The Sonosite probe was then sterilely draped and used to interrogate the carotid artery in both longitudinal and transverse views.  At this point, I washed out the wound, and placed a hemostatic agent throughout. I also gave the patient 30 mg of protamine to reverse anticoagulation.  After waiting a few minutes, I removed the hemostatic agent and washed out the wound. After a few repair stitches with 6-0 prolene, there was no active bleeding in the surgical site. I then reapproximated the SCM to the connective tissue medially. The platysma was then closed with a running stitch of 3-0 Vicryl.  The skin was then reapproximated with a running subcuticular 4-0 Monocryl stitch.  The skin was  then cleaned, dried and Dermabond was used to reinforce the skin closure.  The patient woke without any problems, neurologically intact.     COMPLICATIONS: none apparent  CONDITION: stable  Norman GORMAN Serve MD [facility] Phone Number: [phone] 03/26/2024 2:55 PM

## 2024-03-26 NOTE — Plan of Care (Signed)
  Problem: Education: Goal: Knowledge of General Education information will improve Description: Including pain rating scale, medication(s)/side effects and non-pharmacologic comfort measures Outcome: Progressing   Problem: Clinical Measurements: Goal: Ability to maintain clinical measurements within normal limits will improve Outcome: Progressing Goal: Will remain free from infection Outcome: Progressing Goal: Diagnostic test results will improve Outcome: Progressing Goal: Respiratory complications will improve Outcome: Progressing Goal: Cardiovascular complication will be avoided Outcome: Progressing   Problem: Activity: Goal: Risk for activity intolerance will decrease Outcome: Progressing   Problem: Nutrition: Goal: Adequate nutrition will be maintained Outcome: Progressing   Problem: Coping: Goal: Level of anxiety will decrease Outcome: Progressing   Problem: Elimination: Goal: Will not experience complications related to bowel motility Outcome: Progressing Goal: Will not experience complications related to urinary retention Outcome: Progressing   Problem: Pain Managment: Goal: General experience of comfort will improve and/or be controlled Outcome: Progressing   Problem: Safety: Goal: Ability to remain free from injury will improve Outcome: Progressing   Problem: Skin Integrity: Goal: Risk for impaired skin integrity will decrease Outcome: Progressing   Problem: Coping: Goal: Ability to adjust to condition or change in health will improve Outcome: Progressing   Problem: Fluid Volume: Goal: Ability to maintain a balanced intake and output will improve Outcome: Progressing   Problem: Metabolic: Goal: Ability to maintain appropriate glucose levels will improve Outcome: Progressing   Problem: Nutritional: Goal: Maintenance of adequate nutrition will improve Outcome: Progressing   Problem: Tissue Perfusion: Goal: Adequacy of tissue perfusion will  improve Outcome: Progressing   Problem: Clinical Measurements: Goal: Postoperative complications will be avoided or minimized Outcome: Progressing   Problem: Respiratory: Goal: Will achieve and/or maintain a regular respiratory rate, without signs or symptoms of dyspnea Outcome: Progressing   Problem: Skin Integrity: Goal: Demonstration of wound healing without infection will improve Outcome: Progressing   Problem: Education: Goal: Knowledge of disease or condition will improve Outcome: Progressing   Problem: Ischemic Stroke/TIA Tissue Perfusion: Goal: Complications of ischemic stroke/TIA will be minimized Outcome: Progressing   Problem: Coping: Goal: Will verbalize positive feelings about self Outcome: Progressing Goal: Will identify appropriate support needs Outcome: Progressing   Problem: Health Behavior/Discharge Planning: Goal: Goals will be collaboratively established with patient/family Outcome: Progressing   Problem: Self-Care: Goal: Ability to communicate needs accurately will improve Outcome: Progressing   Problem: Nutrition: Goal: Risk of aspiration will decrease Outcome: Progressing Goal: Dietary intake will improve Outcome: Progressing

## 2024-03-26 NOTE — Progress Notes (Signed)
 MEWS Progress Note  Patient Details Name: Bryan Mueller MRN: [id number] DOB: July 31, 1970 Today's Date: 03/26/2024   MEWS Flowsheet Documentation:  Assess: MEWS Score Temp: (!) 97.5 F (36.4 C) BP: (!) 101/57 MAP (mmHg): 70 Pulse Rate: 77 ECG Heart Rate: 78 Resp: (!) 80 Level of Consciousness: Alert SpO2: 100 % O2 Device: Room Air Patient Activity (if Appropriate): In bed Assess: MEWS Score MEWS Temp: 0 MEWS Systolic: 0 MEWS Pulse: 0 MEWS RR: 3 MEWS LOC: 0 MEWS Score: 3 MEWS Score Color: Yellow Assess: SIRS CRITERIA SIRS Temperature : 0 SIRS Respirations : 1 SIRS Pulse: 0 SIRS WBC: 0 SIRS Score Sum : 1 SIRS Temperature : 0 SIRS Pulse: 0 SIRS Respirations : 1 SIRS WBC: 0 SIRS Score Sum : 1 Assess: if the MEWS score is Yellow or Red Were vital signs accurate and taken at a resting state?: Yes Does the patient meet 2 or more of the SIRS criteria?: No MEWS guidelines implemented : Yes, yellow Treat MEWS Interventions: Considered administering scheduled or prn medications/treatments as ordered Take Vital Signs Increase Vital Sign Frequency : Yellow: Q2hr x1, continue Q4hrs until patient remains green for 12hrs Escalate MEWS: Escalate: Yellow: Discuss with charge nurse and consider notifying provider and/or RRT Notify: Charge Nurse/RN Name of Charge Nurse/RN Notified: Automotive Engineer Provider Notification Provider Name/Title: pearline Date Provider Notified: 03/26/24 Time Provider Notified: TRENNA Laneta JAYSON Rennie 03/26/2024, 6:22 PM

## 2024-03-26 NOTE — Consult Note (Signed)
 Consult Note   Patient: Bryan Mueller FMW:[id number] DOB: 29-Jul-1970 DOA: 03/26/2024 DOS: the patient was seen and examined on 03/26/2024 PCP: Patient, No Pcp Per  Patient coming from: PACU  Requesting physician: Vascular MD Dr.Buckley.   Reason for consultation: Medical management.    HPI:  Bryan Mueller is a 53 y.o. male with past medical history  of diabetes mellitus type 2, tobacco chewing, hyperlipidemia, admitted on 11 November few days ago for altered mental status and a fall patient admitted as he was found to have an left MCA infarct and started on antiepileptic regimen that is currently continued.  Patient found to have CTA that showed advanced atherosclerotic disease showing about 50% stenosis of the left carotid bifurcation.  Patient was deemed appropriate for left carotid endarterectomy with patient today.  Review of Systems  Musculoskeletal:        Neck pain at site of procedure.   All other systems reviewed and are negative.  Past Medical History:  Diagnosis Date   Carotid artery stenosis    Cellulitis    CVA (cerebral vascular accident) (HCC) 03/18/2024   Diabetes mellitus, type 2 (HCC)    History reviewed. No pertinent surgical history.  reports that he has never smoked. His smokeless tobacco use includes chew. He reports that he does not currently use alcohol. He reports that he does not currently use drugs. No Known Allergies History reviewed. No pertinent family history. Prior to Admission medications   Medication Sig Start Date End Date Taking? Authorizing Provider  aspirin EC 81 MG tablet Take 1 tablet (81 mg total) by mouth daily. Swallow whole. 03/22/24  Yes Cindy Garnette POUR, MD  atorvastatin (LIPITOR) 80 MG tablet Take 1 tablet (80 mg total) by mouth daily. 03/22/24  Yes Cindy Garnette POUR, MD  clopidogrel (PLAVIX) 75 MG tablet Take 1 tablet (75 mg total) by mouth daily. 03/22/24  Yes Cindy Garnette POUR, MD  glipiZIDE  (GLUCOTROL ) 5 MG tablet Take 1 tablet (5 mg  total) by mouth 2 (two) times daily before a meal. 03/21/24  Yes Cindy Garnette POUR, MD  levETIRAcetam (KEPPRA) 500 MG tablet Take 1 tablet (500 mg total) by mouth 2 (two) times daily. 03/21/24  Yes Cindy Garnette POUR, MD  metFORMIN  (GLUCOPHAGE ) 1000 MG tablet Take 1 tablet (1,000 mg total) by mouth 2 (two) times daily with a meal. 03/21/24 04/20/24 Yes Cindy Garnette POUR, MD  blood glucose meter kit and supplies Dispense based on patient and insurance preference. Use up to four times daily as directed. (FOR ICD-10 E10.9, E11.9). Patient not taking: Reported on 07/17/2023 12/17/21   Raenelle Coria, MD                                                                                 Vitals:   03/26/24 1700 03/26/24 1715 03/26/24 1730 03/26/24 1745  BP: (!) 91/51 93/60 97/60  (!) 101/57  Pulse: 75 75 76 77  Resp: 12 15 17 13   Temp:      TempSrc:      SpO2: 100% 100% 100% 100%  Weight:      Height:       Physical Exam Vitals reviewed.  Constitutional:  General: He is not in acute distress.    Appearance: He is not ill-appearing.  HENT:     Head: Normocephalic.  Eyes:     Extraocular Movements: Extraocular movements intact.  Neck:     Comments: Left carotid endarterectomy incision.  Cardiovascular:     Rate and Rhythm: Normal rate and regular rhythm.     Pulses: Normal pulses.          Dorsalis pedis pulses are 2+ on the right side and 2+ on the left side.       Posterior tibial pulses are 2+ on the right side and 2+ on the left side.     Heart sounds: Normal heart sounds.     Comments: Skin rash on both feet pt reports is like a sunburn present for 2 weeks or so.   Pulmonary:     Effort: Pulmonary effort is normal.     Breath sounds: Normal breath sounds.  Abdominal:     General: There is no distension.     Palpations: Abdomen is soft.     Tenderness: There is no abdominal tenderness.  Musculoskeletal:     Right lower leg: No edema.     Left lower leg: No edema.  Neurological:      General: No focal deficit present.     Mental Status: He is alert and oriented to person, place, and time.      Results for orders placed or performed during the hospital encounter of 03/26/24 (from the past 48 hours)  Surgical pcr screen     Status: Abnormal   Collection Time: 03/26/24  8:31 AM   Specimen: Nasal Mucosa; Nasal Swab  Result Value Ref Range   MRSA, PCR NEGATIVE NEGATIVE   Staphylococcus aureus POSITIVE (A) NEGATIVE    Comment: (NOTE) The Xpert SA Assay (FDA approved for NASAL specimens in patients 91 years of age and older), is one component of a comprehensive surveillance program. It is not intended to diagnose infection nor to guide or monitor treatment. Performed at [facility], [address], [place], [place] [address]   Glucose, capillary     Status: Abnormal   Collection Time: 03/26/24  8:49 AM  Result Value Ref Range   Glucose-Capillary 176 (H) 70 - 99 mg/dL    Comment: Glucose reference range applies only to samples taken after fasting for at least 8 hours.   Comment 1 Notify RN    Comment 2 Document in Chart   APTT     Status: None   Collection Time: 03/26/24  9:00 AM  Result Value Ref Range   aPTT 31 24 - 36 seconds    Comment: Performed at [facility], [address] [place], [place] [address]  CBC     Status: Abnormal   Collection Time: 03/26/24  9:00 AM  Result Value Ref Range   WBC 10.3 4.0 - 10.5 K/uL   RBC 4.83 4.22 - 5.81 MIL/uL   Hemoglobin 15.0 13.0 - 17.0 g/dL   HCT 58.9 60.9 - 47.9 %   MCV 84.9 80.0 - 100.0 fL   MCH 31.1 26.0 - 34.0 pg   MCHC 36.6 (H) 30.0 - 36.0 g/dL   RDW 88.3 88.4 - 84.4 %   Platelets 224 150 - 400 K/uL   nRBC 0.0 0.0 - 0.2 %    Comment: Performed at [facility], [address], [place], [place] [address]  Comprehensive metabolic panel  Status: Abnormal   Collection Time: 03/26/24  9:00 AM  Result Value Ref Range   Sodium 125 (L) 135 - 145 mmol/L   Potassium 3.4 (L) 3.5 - 5.1 mmol/L    Chloride 96 (L) 98 - 111 mmol/L   CO2 18 (L) 22 - 32 mmol/L   Glucose, Bld 172 (H) 70 - 99 mg/dL    Comment: Glucose reference range applies only to samples taken after fasting for at least 8 hours.   BUN 11 6 - 20 mg/dL   Creatinine, Ser 9.40 (L) 0.61 - 1.24 mg/dL   Calcium  8.2 (L) 8.9 - 10.3 mg/dL   Total Protein 7.0 6.5 - 8.1 g/dL   Albumin 3.2 (L) 3.5 - 5.0 g/dL   AST 31 15 - 41 U/L   ALT 31 0 - 44 U/L   Alkaline Phosphatase 146 (H) 38 - 126 U/L   Total Bilirubin 0.9 0.0 - 1.2 mg/dL   GFR, Estimated >39 >39 mL/min    Comment: (NOTE) Calculated using the CKD-EPI Creatinine Equation (2021)    Anion gap 11 5 - 15    Comment: Performed at [facility], [address], [place], [place] [address]  Type and screen     Status: None   Collection Time: 03/26/24  9:00 AM  Result Value Ref Range   ABO/RH(D) B POS    Antibody Screen NEG    Sample Expiration      03/29/2024,2359 Performed at [facility], [address], [place], [place] [address]   Protime-INR     Status: None   Collection Time: 03/26/24  9:00 AM  Result Value Ref Range   Prothrombin Time 13.9 11.4 - 15.2 seconds   INR 1.0 0.8 - 1.2    Comment: (NOTE) INR goal varies based on device and disease states. Performed at [facility], [address] [place], [place] [address]   ABO/Rh     Status: None   Collection Time: 03/26/24  9:05 AM  Result Value Ref Range   ABO/RH(D)      B POS Performed at [facility], [address], [place], [place] [address]   Urinalysis, Routine w reflex microscopic -Urine, Clean Catch     Status: Abnormal   Collection Time: 03/26/24  9:21 AM  Result Value Ref Range   Color, Urine YELLOW YELLOW   APPearance CLEAR CLEAR   Specific Gravity, Urine 1.009 1.005 - 1.030   pH 6.0 5.0 - 8.0   Glucose, UA 50 (A) NEGATIVE mg/dL   Hgb urine dipstick NEGATIVE NEGATIVE   Bilirubin Urine NEGATIVE NEGATIVE   Ketones, ur NEGATIVE NEGATIVE mg/dL   Protein, ur 899 (A)  NEGATIVE mg/dL   Nitrite NEGATIVE NEGATIVE   Leukocytes,Ua NEGATIVE NEGATIVE   RBC / HPF 0-5 0 - 5 RBC/hpf   WBC, UA 0-5 0 - 5 WBC/hpf   Bacteria, UA RARE (A) NONE SEEN   Squamous Epithelial / HPF 0-5 0 - 5 /HPF    Comment: Performed at [facility], [address] [place], [place] [address]  Glucose, capillary     Status: Abnormal   Collection Time: 03/26/24 11:20 AM  Result Value Ref Range   Glucose-Capillary 133 (H) 70 - 99 mg/dL    Comment: Glucose reference range applies only to samples taken after fasting for at least 8 hours.   Comment 1 Notify RN    Comment 2 Document in Chart   Glucose, capillary  Status: Abnormal   Collection Time: 03/26/24  3:05 PM  Result Value Ref Range   Glucose-Capillary 211 (H) 70 - 99 mg/dL    Comment: Glucose reference range applies only to samples taken after fasting for at least 8 hours.  Comprehensive metabolic panel     Status: Abnormal   Collection Time: 03/26/24  3:44 PM  Result Value Ref Range   Sodium 128 (L) 135 - 145 mmol/L   Potassium 3.6 3.5 - 5.1 mmol/L   Chloride 97 (L) 98 - 111 mmol/L   CO2 17 (L) 22 - 32 mmol/L   Glucose, Bld 203 (H) 70 - 99 mg/dL    Comment: Glucose reference range applies only to samples taken after fasting for at least 8 hours.   BUN 11 6 - 20 mg/dL   Creatinine, Ser 9.22 0.61 - 1.24 mg/dL   Calcium  7.9 (L) 8.9 - 10.3 mg/dL   Total Protein 5.8 (L) 6.5 - 8.1 g/dL   Albumin 2.7 (L) 3.5 - 5.0 g/dL   AST 26 15 - 41 U/L   ALT 27 0 - 44 U/L   Alkaline Phosphatase 124 38 - 126 U/L   Total Bilirubin 1.0 0.0 - 1.2 mg/dL   GFR, Estimated >39 >39 mL/min    Comment: (NOTE) Calculated using the CKD-EPI Creatinine Equation (2021)    Anion gap 14 5 - 15    Comment: Performed at [facility], [address] [place], [place] [address]    Data Reviewed: Relevant notes from primary care and specialist visits, past discharge summaries as available in EHR, including Care Everywhere. Prior diagnostic  testing as pertinent to current admission diagnoses, Updated medications and problem lists for reconciliation ED course, including vitals, labs, imaging, treatment and response to treatment,Triage notes, nursing and pharmacy notes and ED provider's notes Notable results as noted in HPI.Discussed case with EDMD/ ED APP/ or Specialty MD on call and as needed.  Assessment & Plan  >> Carotid atherosclerosis status post left carotid endarterectomy: Patient is currently postop will defer antiplatelet therapy to vascular MD. Continue patient on statin therapy, once patient is awake and passes a swallow evaluation.  >>Hypotension: Vitals:   03/26/24 1505 03/26/24 1515 03/26/24 1530 03/26/24 1545  BP: (!) 77/54 95/60 101/68 (!) 80/56   03/26/24 1600 03/26/24 1615 03/26/24 1630 03/26/24 1645  BP: (!) 89/51 (!) 83/63 (!) 87/51 (!) 86/56   03/26/24 1700 03/26/24 1715 03/26/24 1730 03/26/24 1745  BP: (!) 91/51 93/60 97/60  (!) 101/57  Lactic, 1 L normal saline, normal saline at 75 cc an hour x 1 day thereafter. Fall precautions, aspiration precaution, neurochecks.  Low threshold for albumin.  Patient has no history of congestive heart failure or hemodialysis. Strict I/O.   >>Metabolic acidosis: 2/2 lactic acidosis and we will cont with hydration and follow.    >>Chewing tobacco: D/W pt about cessation and risk of vascular disease .   >> Diabetes mellitus type 2: Most recent A1c of 10.1, glycemic protocol every 4 hourly.   >> Hyponatremia: Mild asymptomatic, attribute to patient's decreased p.o. intake and dehydration, will continue with IV fluid resuscitation and rehydration, TSH, cortisol, lactic acid, Lactic, 1 L normal saline, normal saline at 75 cc an hour x 1 day thereafter.   >> Hypokalemia/hypomagnesemia: Will order levels and replace.   >> Seizure disorder: Continue patient on Keppra. Aspiration and seizure precaution.    Thank you for this consultation.  We will follow the  patient with you.  Time  Spent on Consult: 45 minutes.    Author: Mario LULLA Blanch, MD 03/26/2024 5:54 PM

## 2024-03-27 ENCOUNTER — Encounter (HOSPITAL_COMMUNITY): Payer: Self-pay | Admitting: Vascular Surgery

## 2024-03-27 DIAGNOSIS — Z48812 Encounter for surgical aftercare following surgery on the circulatory system: Secondary | ICD-10-CM

## 2024-03-27 LAB — GLUCOSE, CAPILLARY
Glucose-Capillary: 159 mg/dL — ABNORMAL HIGH (ref 70–99)
Glucose-Capillary: 65 mg/dL — ABNORMAL LOW (ref 70–99)
Glucose-Capillary: 80 mg/dL (ref 70–99)
Glucose-Capillary: 139 mg/dL — ABNORMAL HIGH (ref 70–99)
Glucose-Capillary: 210 mg/dL — ABNORMAL HIGH (ref 70–99)

## 2024-03-27 LAB — LIPID PANEL
Cholesterol: 90 mg/dL (ref 0–200)
HDL: 27 mg/dL — ABNORMAL LOW (ref >40)
LDL Cholesterol: 49 mg/dL (ref 0–99)
Total CHOL/HDL Ratio: 3.3 ratio
Triglycerides: 71 mg/dL (ref <150)
VLDL: 14 mg/dL (ref 0–40)

## 2024-03-27 LAB — BASIC METABOLIC PANEL WITH GFR
Anion gap: 9 (ref 5–15)
Anion gap: 8 (ref 5–15)
BUN: 9 mg/dL (ref 6–20)
BUN: 11 mg/dL (ref 6–20)
CO2: 14 mmol/L — ABNORMAL LOW (ref 22–32)
CO2: 23 mmol/L (ref 22–32)
Calcium: 6 mg/dL — CL (ref 8.9–10.3)
Calcium: 8.4 mg/dL — ABNORMAL LOW (ref 8.9–10.3)
Chloride: 116 mmol/L — ABNORMAL HIGH (ref 98–111)
Chloride: 103 mmol/L (ref 98–111)
Creatinine, Ser: 0.52 mg/dL — ABNORMAL LOW (ref 0.61–1.24)
Creatinine, Ser: 0.62 mg/dL (ref 0.61–1.24)
GFR, Estimated: >60 mL/min (ref >60)
GFR, Estimated: >60 mL/min (ref >60)
Glucose, Bld: 151 mg/dL — ABNORMAL HIGH (ref 70–99)
Glucose, Bld: 192 mg/dL — ABNORMAL HIGH (ref 70–99)
Potassium: 2.8 mmol/L — ABNORMAL LOW (ref 3.5–5.1)
Potassium: 3.3 mmol/L — ABNORMAL LOW (ref 3.5–5.1)
Sodium: 139 mmol/L (ref 135–145)
Sodium: 134 mmol/L — ABNORMAL LOW (ref 135–145)

## 2024-03-27 LAB — CBC
HCT: 31 % — ABNORMAL LOW (ref 39.0–52.0)
Hemoglobin: 11.9 g/dL — ABNORMAL LOW (ref 13.0–17.0)
MCH: 31.9 pg (ref 26.0–34.0)
MCHC: 38.4 g/dL — ABNORMAL HIGH (ref 30.0–36.0)
MCV: 83.1 fL (ref 80.0–100.0)
Platelets: 218 K/uL (ref 150–400)
RBC: 3.73 MIL/uL — ABNORMAL LOW (ref 4.22–5.81)
RDW: 11.8 % (ref 11.5–15.5)
WBC: 13 K/uL — ABNORMAL HIGH (ref 4.0–10.5)
nRBC: 0 % (ref 0.0–0.2)

## 2024-03-27 LAB — POCT ACTIVATED CLOTTING TIME
Activated Clotting Time: 308 s
Activated Clotting Time: 256 s

## 2024-03-27 MED ORDER — PSEUDOEPHEDRINE HCL 30 MG PO TABS: 30.0000 mg | ORAL_TABLET | Freq: Four times a day (QID) | ORAL | Status: DC | PRN

## 2024-03-27 MED ORDER — OXYCODONE-ACETAMINOPHEN 5-325 MG PO TABS: 1.0000 | ORAL_TABLET | ORAL | 0 refills | Status: DC | PRN

## 2024-03-27 MED ORDER — MIDODRINE HCL 5 MG PO TABS
5.0000 mg | ORAL_TABLET | Freq: Three times a day (TID) | ORAL | Status: DC
  Administered 2024-03-27 – 2024-03-28 (×3): 5 mg via ORAL
  Filled 2024-03-27 (×3): qty 1

## 2024-03-27 MED ORDER — SODIUM CHLORIDE 0.9 % IV BOLUS
1000.0000 mL | Freq: Once | INTRAVENOUS | Status: AC
  Administered 2024-03-27: 1000 mL via INTRAVENOUS

## 2024-03-27 NOTE — Progress Notes (Addendum)
  Progress Note    03/27/2024 8:14 AM 1 Day Post-Op  Subjective:  no complaints. Minimal incisional pain. Sitting up in bed. Tolerated dinner last night. Has not yet ambulated   Vitals:   03/27/24 0300 03/27/24 0500  BP: (!) 127/49 (!) 114/50  Pulse: 83 73  Resp: 15 14  Temp: 98.2 F (36.8 C) 98 F (36.7 C)  SpO2: 92% 99%   Physical Exam: Cardiac:  regular Lungs:  non labored Incisions:  left neck incision is clean, dry and intact. Soft without hematoma Extremities:  moving all extremities without deficits. 5/5 grip strength Neurologic: alert and oriented. Speech coherent. Smile symmetric. Tongue midline  CBC    Component Value Date/Time   WBC 13.0 (H) 03/27/2024 0502   RBC 3.73 (L) 03/27/2024 0502   HGB 11.9 (L) 03/27/2024 0502   HCT 31.0 (L) 03/27/2024 0502   PLT 218 03/27/2024 0502   MCV 83.1 03/27/2024 0502   MCH 31.9 03/27/2024 0502   MCHC 38.4 (H) 03/27/2024 0502   RDW 11.8 03/27/2024 0502   LYMPHSABS 1.5 03/19/2024 0010   MONOABS 0.9 03/19/2024 0010   EOSABS 0.1 03/19/2024 0010   BASOSABS 0.1 03/19/2024 0010    BMET    Component Value Date/Time   NA 134 (L) 03/27/2024 0717   K 3.3 (L) 03/27/2024 0717   CL 103 03/27/2024 0717   CO2 23 03/27/2024 0717   GLUCOSE 192 (H) 03/27/2024 0717   BUN 11 03/27/2024 0717   CREATININE 0.62 03/27/2024 0717   CALCIUM  8.4 (L) 03/27/2024 0717   GFRNONAA >60 03/27/2024 0717   GFRAA >60 06/02/2018 2131    INR    Component Value Date/Time   INR 1.0 03/26/2024 0900     Intake/Output Summary (Last 24 hours) at 03/27/2024 0814 Last data filed at 03/27/2024 0300 Gross per 24 hour  Intake 1892.21 ml  Output 1250 ml  Net 642.21 ml     Assessment/Plan:  53 y.o. male is s/p left CEA with bovine patch angioplasty 1 Day Post-Op   Neurologically intact Left neck incision is clean, dry and intact without swelling or hematoma Blood pressure has been soft. Asymptomatic. Getting NS at 75 cc/hr. Also using too large  of cuff in room asked RN to change to regular size cuff May benefit from dose of pseudoephedrine  He has not yet ambulated Appreciate TRH Assistance Continue Aspirin, statin, Plavix Anticipate d/c home later today once cleared by TRH and if tolerating ambulation and SBP> 100 Will arrange follow up in our office in 2-3 weeks for incision check  Teretha Damme, PA-C [facility] [phone] 03/27/2024 8:14 AM  VASCULAR STAFF ADDENDUM: I have independently interviewed and examined the patient. I agree with the above.   Norman GORMAN Serve MD [facility] Phone Number: [phone] 03/27/2024 12:53 PM

## 2024-03-27 NOTE — Progress Notes (Signed)
 Notified of critical hypocalcemia on AM labs.   BMP results look to be spurious. Plan to repeat BMP prior to making any treatment decisions.

## 2024-03-27 NOTE — Consult Note (Signed)
 Bryan Mueller  FMW:[id number] DOB: [birth year] DOA: 03/26/2024 PCP: Patient, No Pcp Per    Brief Narrative:  53 year old with a history of DM2, chronic use of chewing tobacco, and HLD who was admitted 03/19/2024 with AMS and a fall during which time he was found to have suffered a left MCA CVA and was also believed to have suffered seizures.  Workup revealed advanced atherosclerotic disease of the left carotid bifurcation.  Patient was discharged home in stable condition and readmitted 03/26/2024 for an elective left carotid endarterectomy.  TRH is following to address his medical issues during his hospital stay.  Goals of Care:   Code Status: Full Code   DVT prophylaxis: SCD's Start: 03/26/24 1806   Interim Hx: Afebrile.  Blood pressures remain soft with systolics 90-114.  Resting comfortably at time of visit.  Has no new complaints.  Alert oriented and pleasant.  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 -status post left CEA Care per Vascular Surgery - on aspirin  and Plavix  at time of admission  Mild hypotension Baseline systolics from recent hospital stay typically 106-150 - has dipped into the 90s during this admission - clinically appears volume depleted - bolus with 1 L normal saline again today and follow  Suspected seizure disorder Keppra  initiated at 500 mg twice daily during hospitalization earlier this month with the patient instructed not to drive until seizure-free for 6 months - continue Keppra  as per home dose  Mild metabolic acidosis Due to lactic acidosis in setting of dehydration and hypotension - corrected  DM2 Most recent A1c 10.1 -on glipizide  and metformin  as outpatient but reports dizziness with use of metformin  and is requesting to be discontinued - CBG presently well-controlled as inpatient  Hyponatremia Due to poor intake and volume depletion - improving with volume expansion  Hypokalemia Due to poor intake -improving with  supplementation  Hypocalcemia Due to poor intake -improving with supplementation  Hypomagnesemia Due to poor intake -recheck in a.m.  Seizure disorder Continue Keppra  dose   Family Communication: No family present at time of exam   Objective: Blood pressure 101/61, pulse 65, temperature 97.6 F (36.4 C), temperature source Oral, resp. rate 16, height 5' 6 (1.676 m), weight 68 kg, SpO2 100%.  Intake/Output Summary (Last 24 hours) at 03/27/2024 1044 Last data filed at 03/27/2024 1020 Gross per 24 hour  Intake 1892.21 ml  Output 1400 ml  Net 492.21 ml   Filed Weights   03/26/24 0845  Weight: 68 kg    Examination: General: No acute respiratory distress Lungs: Clear to auscultation bilaterally without wheezes or crackles Cardiovascular: Regular rate and rhythm without murmur gallop or rub normal S1 and S2 Abdomen: Nontender, nondistended, soft, bowel sounds positive, no rebound, no ascites, no appreciable mass Extremities: No significant cyanosis, clubbing, or edema bilateral lower extremities  CBC: Recent Labs  Lab 03/21/24 0443 03/26/24 0900 03/27/24 0502  WBC 10.8* 10.3 13.0*  HGB 14.7 15.0 11.9*  HCT 41.2 41.0 31.0*  MCV 87.5 84.9 83.1  PLT 236 224 218   Basic Metabolic Panel: Recent Labs  Lab 03/26/24 1544 03/26/24 1845 03/27/24 0502 03/27/24 0717  NA 128*  --  139 134*  K 3.6  --  2.8* 3.3*  CL 97*  --  116* 103  CO2 17*  --  14* 23  GLUCOSE 203*  --  151* 192*  BUN 11  --  9 11  CREATININE 0.77  --  0.52* 0.62  CALCIUM  7.9*  --  6.0* 8.4*  MG  --  1.4*  --   --    GFR: Estimated Creatinine Clearance: 98.6 mL/min (by C-G formula based on SCr of 0.62 mg/dL).   Scheduled Meds:  aspirin EC  81 mg Oral Daily   atorvastatin  80 mg Oral Daily   Chlorhexidine Gluconate Cloth  6 each Topical Daily   clopidogrel  75 mg Oral Daily   docusate sodium  100 mg Oral Daily   glipiZIDE   5 mg Oral BID AC   insulin  aspart  0-9 Units Subcutaneous TID WC    levETIRAcetam  500 mg Oral BID   metFORMIN   1,000 mg Oral BID WC   mupirocin ointment  1 Application Nasal BID   Continuous Infusions:  sodium chloride      sodium chloride      sodium chloride  75 mL/hr at 03/26/24 2028     LOS: 1 day   Reyes IVAR Moores, MD [facility] Office  [phone] Pager - Text Page per Tracey  If 7PM-7AM, please contact night-coverage per Amion 03/27/2024, 10:44 AM

## 2024-03-27 NOTE — Progress Notes (Signed)
 Mobility Specialist Progress Note:   03/27/24 1001  Mobility  Activity Dangled on edge of bed;Turned to back - supine;Stood at bedside  Level of Assistance Independent  Assistive Device None  Activity Response Tolerated well  Mobility Referral Yes  Mobility visit 1 Mobility  Mobility Specialist Start Time (ACUTE ONLY) 1001  Mobility Specialist Stop Time (ACUTE ONLY) 1012  Mobility Specialist Time Calculation (min) (ACUTE ONLY) 11 min   Pt received ambulating in room. Agreeable to orthostatics. See below. Tolerated well, asx throughout until standing at 3 min. Pt reported feeling dizzy. Pt sat EOB. Left with all needs met. Wife in room.   Supine: 96/58 (69) Dangle EOB: 96/55 (68) Stand 1 min: 88/44 (56) Stand 3 min: 87/45 (57) * Reported Dizziness*  Nyshawn Gowdy Mobility Specialist Please contact via SecureChat or  Rehab office at [phone]

## 2024-03-27 NOTE — Inpatient Diabetes Management (Signed)
 Inpatient Diabetes Program Recommendations  AACE/ADA: New Consensus Statement on Inpatient Glycemic Control (2015)  Target Ranges:  Prepandial:   less than 140 mg/dL      Peak postprandial:   less than 180 mg/dL (1-2 hours)      Critically ill patients:  140 - 180 mg/dL   Lab Results  Component Value Date   GLUCAP 159 (H) 03/27/2024   HGBA1C 10.3 (H) 03/19/2024    Review of Glycemic Control  Latest Reference Range & Units 03/26/24 08:49 03/26/24 11:20 03/26/24 15:05 03/26/24 21:32 03/27/24 06:25  Glucose-Capillary 70 - 99 mg/dL 823 (H) 866 (H) 788 (H) 175 (H) 159 (H)   Diabetes history: DM 2 Outpatient Diabetes medications: Glipizide  5 mg bid, metformin  1000 mg bid Current orders for Inpatient glycemic control:  Glipizide  5 mg bid Metformin  1000 mg bid Novolog  0-9 units tid  A1c 10.3% on 11/11  Note Decadron 10 mg given yesterday. Diabetes Coordinator counseled with pt extensively on 11/12 during previous admission.  Glucose trends okay for now.  Thanks,  Clotilda Bull RN, MSN, BC-ADM Inpatient Diabetes Coordinator Team Pager [phone] (8a-5p)

## 2024-03-27 NOTE — Anesthesia Postprocedure Evaluation (Signed)
 Anesthesia Post Note  Patient: [name]  Procedure(s) Performed: ENDARTERECTOMY, LEFT CAROTID wit PATCH ANGIOPLASTY (Left: Neck)     Patient location during evaluation: PACU Anesthesia Type: General Level of consciousness: awake and alert Pain management: pain level controlled Vital Signs Assessment: post-procedure vital signs reviewed and stable Respiratory status: spontaneous breathing, nonlabored ventilation, respiratory function stable and patient connected to nasal cannula oxygen Cardiovascular status: blood pressure returned to baseline and stable Postop Assessment: no apparent nausea or vomiting Anesthetic complications: no   No notable events documented.          Laquia Rosano D Kalecia Hartney

## 2024-03-28 ENCOUNTER — Other Ambulatory Visit (HOSPITAL_COMMUNITY): Payer: Self-pay

## 2024-03-28 ENCOUNTER — Ambulatory Visit: Payer: Self-pay | Admitting: Adult Health

## 2024-03-28 LAB — CBC
HCT: 29.5 % — ABNORMAL LOW (ref 39.0–52.0)
Hemoglobin: 10.9 g/dL — ABNORMAL LOW (ref 13.0–17.0)
MCH: 31.7 pg (ref 26.0–34.0)
MCHC: 36.9 g/dL — ABNORMAL HIGH (ref 30.0–36.0)
MCV: 85.8 fL (ref 80.0–100.0)
Platelets: 197 K/uL (ref 150–400)
RBC: 3.44 MIL/uL — ABNORMAL LOW (ref 4.22–5.81)
RDW: 12.1 % (ref 11.5–15.5)
WBC: 10.4 K/uL (ref 4.0–10.5)
nRBC: 0 % (ref 0.0–0.2)

## 2024-03-28 LAB — BASIC METABOLIC PANEL WITH GFR
Anion gap: 11 (ref 5–15)
BUN: 6 mg/dL (ref 6–20)
CO2: 18 mmol/L — ABNORMAL LOW (ref 22–32)
Calcium: 7.6 mg/dL — ABNORMAL LOW (ref 8.9–10.3)
Chloride: 108 mmol/L (ref 98–111)
Creatinine, Ser: 0.7 mg/dL (ref 0.61–1.24)
GFR, Estimated: >60 mL/min (ref >60)
Glucose, Bld: 230 mg/dL — ABNORMAL HIGH (ref 70–99)
Potassium: 3.4 mmol/L — ABNORMAL LOW (ref 3.5–5.1)
Sodium: 137 mmol/L (ref 135–145)

## 2024-03-28 LAB — GLUCOSE, CAPILLARY
Glucose-Capillary: 218 mg/dL — ABNORMAL HIGH (ref 70–99)
Glucose-Capillary: 219 mg/dL — ABNORMAL HIGH (ref 70–99)
Glucose-Capillary: 100 mg/dL — ABNORMAL HIGH (ref 70–99)

## 2024-03-28 LAB — MAGNESIUM: Magnesium: 1.3 mg/dL — ABNORMAL LOW (ref 1.7–2.4)

## 2024-03-28 LAB — CORTISOL: Cortisol, Plasma: 19 ug/dL

## 2024-03-28 MED ORDER — OXYCODONE-ACETAMINOPHEN 5-325 MG PO TABS
1.0000 | ORAL_TABLET | Freq: Four times a day (QID) | ORAL | 0 refills | Status: AC | PRN
  Filled 2024-03-28: qty 20, 5d supply, fill #0

## 2024-03-28 MED ORDER — POTASSIUM CHLORIDE CRYS ER 20 MEQ PO TBCR
40.0000 meq | EXTENDED_RELEASE_TABLET | Freq: Once | ORAL | Status: DC
  Filled 2024-03-28: qty 2

## 2024-03-28 MED ORDER — GLIPIZIDE 10 MG PO TABS
10.0000 mg | ORAL_TABLET | Freq: Two times a day (BID) | ORAL | 0 refills | Status: AC
  Filled 2024-03-28: qty 60, 30d supply, fill #0

## 2024-03-28 MED ORDER — GLIPIZIDE 10 MG PO TABS
10.0000 mg | ORAL_TABLET | Freq: Two times a day (BID) | ORAL | Status: DC
  Filled 2024-03-28: qty 1

## 2024-03-28 MED ORDER — POTASSIUM CHLORIDE CRYS ER 20 MEQ PO TBCR
40.0000 meq | EXTENDED_RELEASE_TABLET | Freq: Once | ORAL | Status: AC
  Administered 2024-03-28: 40 meq via ORAL
  Filled 2024-03-28: qty 2

## 2024-03-28 MED ORDER — MAGNESIUM GLUCONATE 500 (27 MG) MG PO TABS
500.0000 mg | ORAL_TABLET | Freq: Once | ORAL | Status: AC
  Administered 2024-03-28: 500 mg via ORAL
  Filled 2024-03-28: qty 1

## 2024-03-28 MED ORDER — MAGNESIUM SULFATE 4 GM/100ML IV SOLN
4.0000 g | Freq: Once | INTRAVENOUS | Status: DC
  Filled 2024-03-28: qty 100

## 2024-03-28 NOTE — Progress Notes (Addendum)
 DISCHARGE NOTE HOME Bryan Mueller to be discharged Home per MD order. Discussed prescriptions and follow up appointments with the patient. Prescriptions given to patient; medication list explained in detail. Patient verbalized understanding. Stroke information reviewed including sign and symptoms, causes, calling 911, how diabetes causes stroke risk and other aspects of stroke  provided stroke hand outs.  Skin clean, dry and intact without evidence of skin break down, no evidence of skin tears noted. IV catheter discontinued intact. Site without signs and symptoms of complications. Dressing and pressure applied. Pt denies pain at the site currently. No complaints noted.  See Lda for Surgical incsions Patient free of lines, drains, and wounds.   An After Visit Summary (AVS) was printed and given to the patient. Patient escorted via wheelchair, and discharged home via private auto.  Peyton SHAUNNA Pepper, RN

## 2024-03-28 NOTE — Progress Notes (Signed)
  Progress Note    03/28/2024 8:18 AM 2 Days Post-Op  Subjective:  sitting up in chair eating breakfast. Denies any pain. Says he tolerated walking yesterday. No complaints this morning. Feels good   Vitals:   03/28/24 0314 03/28/24 0450  BP: 113/72   Pulse: 97   Resp: 17   Temp: 100.2 F (37.9 C) 99 F (37.2 C)  SpO2: 100%    Physical Exam: Cardiac:  regular Lungs:  non labored Incisions:  left neck incision is clean, dry and intact Extremities:  moving all extremities without deficits Neurologic: alert and oriented. Speech coherent  CBC    Component Value Date/Time   WBC 10.4 03/28/2024 0325   RBC 3.44 (L) 03/28/2024 0325   HGB 10.9 (L) 03/28/2024 0325   HCT 29.5 (L) 03/28/2024 0325   PLT 197 03/28/2024 0325   MCV 85.8 03/28/2024 0325   MCH 31.7 03/28/2024 0325   MCHC 36.9 (H) 03/28/2024 0325   RDW 12.1 03/28/2024 0325   LYMPHSABS 1.5 03/19/2024 0010   MONOABS 0.9 03/19/2024 0010   EOSABS 0.1 03/19/2024 0010   BASOSABS 0.1 03/19/2024 0010    BMET    Component Value Date/Time   NA 137 03/28/2024 0325   K 3.4 (L) 03/28/2024 0325   CL 108 03/28/2024 0325   CO2 18 (L) 03/28/2024 0325   GLUCOSE 230 (H) 03/28/2024 0325   BUN 6 03/28/2024 0325   CREATININE 0.70 03/28/2024 0325   CALCIUM  7.6 (L) 03/28/2024 0325   GFRNONAA >60 03/28/2024 0325   GFRAA >60 06/02/2018 2131    INR    Component Value Date/Time   INR 1.0 03/26/2024 0900     Intake/Output Summary (Last 24 hours) at 03/28/2024 0818 Last data filed at 03/28/2024 0041 Gross per 24 hour  Intake 1838.01 ml  Output 150 ml  Net 1688.01 ml     Assessment/Plan:  53 y.o. male is s/p left CEA with bovine patch angioplasty  2 Days Post-Op   Neurologically intact Left neck incision is clean, dry and intact without swelling or hematoma Blood pressure remains soft. SBP 90-120's. Asymptomatic. Got an additional 1000 ml bolus yesterday. Was given dose of pseudoephedrine . Minimal improvement. Also  given midodrine  yesterday Tolerating ambulation. Did report some dizziness but felt this was associated with his metformin . Reported to RN and mobility specialist that the metformin  had been making him dizzy. Metformin  d/c by [facility] Appreciate [facility] Assistance with management Low grade fever overnight. WBC normal Continue Aspirin , statin, Plavix  Possible d/c home later today if blood pressures normalize  Teretha Damme, PA-C [facility] [phone] 03/28/2024 8:18 AM

## 2024-03-28 NOTE — Consult Note (Signed)
 Bryan Mueller  FMW:[id number] DOB: 13-Jul-1970 DOA: 03/26/2024 PCP: Patient, No Pcp Per    Brief Narrative:  53 year old with a history of DM2, chronic use of chewing tobacco, and HLD who was admitted 03/19/2024 with AMS and a fall during which time he was found to have suffered a left MCA CVA and was also believed to have suffered seizures.  Workup revealed advanced atherosclerotic disease of the left carotid bifurcation.  Patient was discharged home in stable condition and readmitted 03/26/2024 for an elective left carotid endarterectomy.  [facility] is following to address his medical issues during his hospital stay.  Goals of Care:   Code Status: Full Code   DVT prophylaxis: SCD's Start: 03/26/24 1806   Interim Hx: Blood pressure has improved overnight with a combination of volume expansion and midodrine initiation.  Systolics presently 113-118.  Vitals otherwise stable.  CBG variable at 65-219.  Walking the hall without difficulty.  Sitting up in a bedside chair at the time of my visit with no complaints.  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 -status post left CEA Care per Vascular Surgery - on aspirin and Plavix   Mild hypotension Baseline systolics from recent hospital stay typically 106-150 - has dipped into the 90s during this admission - clinically appeared volume depleted - BP has improved with volume expansion and short-term use of low dose midodrine -if hypotension recurs in outpatient setting consider addition of midodrine 5 mg 3 times daily  Suspected seizure disorder Keppra initiated at 500 mg twice daily during hospitalization earlier this month with the patient instructed not to drive until seizure-free for 6 months - continue Keppra as per home dose  Mild metabolic acidosis Felt to be due to lactic acidosis in setting of dehydration and hypotension, and possibly exacerbated by metformin  - metformin  discontinued - will need follow-up of this issue  in the outpatient setting  DM2 Most recent A1c 10.1 - on glipizide  and metformin  as outpatient but reports dizziness with use of metformin  and is requesting it be discontinued - CBG not ideally controlled w/o metformin  - consider semaglutide v/s insulin  as outpatient if A1c does not improve - counseled on need for strict DM diet adherence - increased glucotrol  dose from 5 BID to 10 BID for now   Hyponatremia Due to poor intake and volume depletion - improved with volume expansion  Hypokalemia Due to poor intake -improved with supplementation  Hypocalcemia Due to poor intake -follow-up as outpatient  Hypomagnesemia Due to poor intake - supplemented prior to discharge -follow-up as outpatient   Objective: Blood pressure 118/75, pulse 75, temperature 98.4 F (36.9 C), temperature source Oral, resp. rate 19, height 5' 6 (1.676 m), weight 68 kg, SpO2 100%.  Intake/Output Summary (Last 24 hours) at 03/28/2024 1014 Last data filed at 03/28/2024 0041 Gross per 24 hour  Intake 1838.01 ml  Output 150 ml  Net 1688.01 ml   Filed Weights   03/26/24 0845  Weight: 68 kg    Examination: General: No acute respiratory distress Lungs: Clear to auscultation bilaterally without wheezes  Cardiovascular: Regular rate and rhythm without murmur Abdomen: Nontender, nondistended, soft, bowel sounds positive, no rebound Extremities: No significant cyanosis, clubbing, or edema bilateral lower extremities  CBC: Recent Labs  Lab 03/26/24 0900 03/27/24 0502 03/28/24 0325  WBC 10.3 13.0* 10.4  HGB 15.0 11.9* 10.9*  HCT 41.0 31.0* 29.5*  MCV 84.9 83.1 85.8  PLT 224 218 197   Basic Metabolic Panel: Recent Labs  Lab 03/26/24 1845 03/27/24 0502 03/27/24 0717 03/28/24 0325  NA  --  139 134* 137  K  --  2.8* 3.3* 3.4*  CL  --  116* 103 108  CO2  --  14* 23 18*  GLUCOSE  --  151* 192* 230*  BUN  --  9 11 6   CREATININE  --  0.52* 0.62 0.70  CALCIUM   --  6.0* 8.4* 7.6*  MG 1.4*  --   --   1.3*   GFR: Estimated Creatinine Clearance: 98.6 mL/min (by C-G formula based on SCr of 0.7 mg/dL).   Scheduled Meds:  aspirin EC  81 mg Oral Daily   atorvastatin  80 mg Oral Daily   Chlorhexidine Gluconate Cloth  6 each Topical Daily   clopidogrel  75 mg Oral Daily   docusate sodium  100 mg Oral Daily   glipiZIDE   5 mg Oral BID AC   insulin  aspart  0-9 Units Subcutaneous TID WC   levETIRAcetam  500 mg Oral BID   midodrine  5 mg Oral TID WC   mupirocin ointment  1 Application Nasal BID      LOS: 2 days   Bryan IVAR Moores, MD [facility] Office  [phone] Pager - Text Page per Tracey  If 7PM-7AM, please contact night-coverage per Amion 03/28/2024, 10:14 AM

## 2024-03-28 NOTE — Progress Notes (Signed)
   03/28/24 1033  Mobility  Activity Ambulated with assistance  Level of Assistance Standby assist, set-up cues, supervision of patient - no hands on  Assistive Device None  Distance Ambulated (ft) 500 ft  Activity Response Tolerated well  Mobility Referral Yes  Mobility visit 1 Mobility  Mobility Specialist Start Time (ACUTE ONLY) 1033  Mobility Specialist Stop Time (ACUTE ONLY) 1043  Mobility Specialist Time Calculation (min) (ACUTE ONLY) 10 min   Mobility Specialist: Progress Note  Pre-Mobility:      HR 67, BP 110/68 (83) During Mobility: HR 87 Post-Mobility:    HR 67, BP  108/62 (77)  Pt agreeable to mobility session - received in bed. Pt was asymptomatic throughout session with no complaints. Returned to chair with all needs met - call bell within reach.   Additional comments:  Virgle Boards, BS Mobility Specialist Please contact via SecureChat or  Rehab office at [phone].

## 2024-03-28 NOTE — Progress Notes (Signed)
 Patient had temp 100.2 @0314 . Rechecked temp 99 @ 0450.  Gave patient IS and did patient teaching. Informed patient that it will help with the temps and open his lungs.  Patient voiced understanding

## 2024-03-28 NOTE — Plan of Care (Signed)
   Problem: Clinical Measurements: Goal: Will remain free from infection Outcome: Progressing Goal: Diagnostic test results will improve Outcome: Progressing

## 2024-04-02 NOTE — Discharge Summary (Signed)
 Carotid Discharge Summary     Bryan Mueller 11-15-1970 53 y.o. male  [id number]  Admission Date: 03/26/2024  Discharge Date: 03/28/2024  Physician: Dr. Norman Serve  Admission Diagnosis: Stenosis of left carotid artery [I65.22] Symptomatic carotid artery stenosis, left [I65.22]  Discharge Day services:   Evaluation by [facility]   Hospital Course:  The patient was admitted to the hospital and taken to the operating room on 03/26/2024 and underwent left carotid endarterectomy.  The pt tolerated the procedure well and was transported to the PACU in good condition.  By POD 1, the pt neuro status remained intact. [facility] consulted for assistance with medical management. Some post operative hypotension. Patient minimally symptomatic, only on standing/ ambulation some dizziness. Hemodynamically stable. Normal Saline 1 L bolus given then maintained at 75 cc per hour. Suspected combination of dehydration, decreased po intake. Some hyponatremia as well. Replaced electrolytes. Labs ordered.  POD#2, continued good progression post operatively with no overnight issues. Minimal incisional pain. Neurologically intact. Sitting up in bed. Tolerating diet. Some hypocalcemia on morning labs. Hypotension continued. Ordered pseudoephedrine  to see if this would help with blood pressure. Encouraged patient to ambulate. Continued on Aspirin , statin and Plavix  post operatively. No incisional concerns. Patient reported some dizziness with metformin . Acidosis corrected with hydration. Hyponatremia, Hypokalemia, hypocalcemia and hypomagnesemia all improving with supplementation and increased po intake.   The remainder of the hospital course consisted of increasing mobilization and increasing intake of solids without difficulty.  POD#3, he remained neurologically intact. Low grade fever overnight. No leukocytosis. Encouraged incentive spirometry. Improved walking  tolerance once metformin  discontinued. No other complaints. Feeling well and eager to go home. Blood pressures somewhat labile still. Asymptomatic. Started on midodrine  by Hospitalist Dr. Danton. Some improvement.  Acidosis and metabolic derangements all improved. His glucose not well controlled without metformin  so glucotrol  was increased. He will have follow up with PCP to consider Semaglutide vs insulin  if no improvement in A1C. Diet adherence discussed with patient. He will also follow up with PCP outpatient to discuss hypotension management. He remained stable from Hospitalist standpoint and vascular standpoint for discharge home. He will resume home medications as prescribed. He will continue on Aspirin , Statin and Plavix . PDMP was reviewed and post operative pain medication was sent to patients pharmacy. Follow up in our office arranged in 2-3 weeks for incision check.    No results for input(s): NA, K, CL, CO2, GLUCOSE, BUN, CALCIUM  in the last 72 hours.  Invalid input(s): CRATININE No results for input(s): WBC, HGB, HCT, PLT in the last 72 hours. No results for input(s): INR in the last 72 hours.   Discharge Instructions     CAROTID Sugery: Call MD for difficulty swallowing or speaking; weakness in arms or legs that is a new symtom; severe headache.  If you have increased swelling in the neck and/or  are having difficulty breathing, CALL 911   Complete by: As directed    CAROTID Sugery: Call MD for difficulty swallowing or speaking; weakness in arms or legs that is a new symtom; severe headache.  If you have increased swelling in the neck and/or  are having difficulty breathing, CALL 911   Complete by: As directed    Call MD for:  redness, tenderness, or signs of infection (pain, swelling, bleeding, redness, odor or green/yellow discharge around incision site)   Complete by: As directed    Call MD for:  redness, tenderness, or signs of infection (pain, swelling,  bleeding, redness, odor or  green/yellow discharge around incision site)   Complete by: As directed    Call MD for:  severe or increased pain, loss or decreased feeling  in affected limb(s)   Complete by: As directed    Call MD for:  severe or increased pain, loss or decreased feeling  in affected limb(s)   Complete by: As directed    Call MD for:  temperature >100.5   Complete by: As directed    Call MD for:  temperature >100.5   Complete by: As directed    Discharge wound care:   Complete by: As directed    Keep incision dry for 24 hours. You can then wash with mild soap and water, pat dry. Do not soak in bathtub, pool, etc   Discharge wound care:   Complete by: As directed    Keep incision dry for 24 hours. You can then wash with mild soap and water, pat dry. Do not soak in bathtub, pool, etc   Driving Restrictions   Complete by: As directed    No driving for 2 weeks or while taking narcotic pain medication   Driving Restrictions   Complete by: As directed    No driving for 2 weeks   Increase activity slowly   Complete by: As directed    Walk with assistance use walker or cane as needed   Increase activity slowly   Complete by: As directed    Walk with assistance use walker or cane as needed   Lifting restrictions   Complete by: As directed    No lifting for 4 weeks   Lifting restrictions   Complete by: As directed    No lifting for 4 weeks   Resume previous diet   Complete by: As directed    Resume previous diet   Complete by: As directed        Discharge Diagnosis:  Stenosis of left carotid artery [I65.22] Symptomatic carotid artery stenosis, left [I65.22]  Secondary Diagnosis: Patient Active Problem List   Diagnosis Date Noted   Symptomatic carotid artery stenosis, left 03/26/2024   Acute ischemic stroke (HCC) 03/19/2024   Hypokalemia 07/16/2023   Hypocalcemia 07/16/2023   Intractable vomiting with nausea 07/16/2023   Cellulitis of left hand 12/15/2021    Uncontrolled type 2 diabetes mellitus with hyperglycemia, without long-term current use of insulin  (HCC) 12/15/2021   Leukocytosis 12/15/2021   Conjunctivitis 12/15/2021   Lobar pneumonia 09/07/2014   Hyponatremia 09/07/2014   Thrombocytopenia 09/07/2014   Transaminasemia 09/07/2014   Past Medical History:  Diagnosis Date   Carotid artery stenosis    Cellulitis    CVA (cerebral vascular accident) (HCC) 03/18/2024   Diabetes mellitus, type 2 (HCC)     Allergies as of 03/28/2024   No Known Allergies      Medication List     STOP taking these medications    metFORMIN  1000 MG tablet Commonly known as: GLUCOPHAGE        TAKE these medications    Accu-Chek Guide w/Device Kit Dispense based on patient and insurance preference. Use up to four times daily as directed. (FOR ICD-10 E10.9, E11.9).   aspirin  EC 81 MG tablet Take 1 tablet (81 mg total) by mouth daily. Swallow whole.   atorvastatin  80 MG tablet Commonly known as: LIPITOR Take 1 tablet (80 mg total) by mouth daily.   clopidogrel  75 MG tablet Commonly known as: PLAVIX  Take 1 tablet (75 mg total) by mouth daily.   glipiZIDE  10 MG tablet Commonly known  as: GLUCOTROL  Take 1 tablet (10 mg total) by mouth 2 (two) times daily before a meal. What changed:  medication strength how much to take   levETIRAcetam  500 MG tablet Commonly known as: KEPPRA  Take 1 tablet (500 mg total) by mouth 2 (two) times daily.   oxyCODONE -acetaminophen  5-325 MG tablet Commonly known as: PERCOCET/ROXICET Take 1 tablet by mouth every 6 (six) hours as needed for severe pain (pain score 7-10).               Discharge Care Instructions  (From admission, onward)           Start     Ordered   03/27/24 0000  Discharge wound care:       Comments: Keep incision dry for 24 hours. You can then wash with mild soap and water, pat dry. Do not soak in bathtub, pool, etc   03/27/24 0826   03/27/24 0000  Discharge wound care:        Comments: Keep incision dry for 24 hours. You can then wash with mild soap and water, pat dry. Do not soak in bathtub, pool, etc   03/27/24 1636             Discharge Instructions:   [facility] Discharge Instructions Carotid Endarterectomy (CEA)  Please refer to the following instructions for your post-procedure care. Your surgeon or physician assistant will discuss any changes with you.  Activity  You are encouraged to walk as much as you can. You can slowly return to normal activities but must avoid strenuous activity and heavy lifting until your doctor tell you it's OK. Avoid activities such as vacuuming or swinging a golf club. You can drive after one week if you are comfortable and you are no longer taking prescription pain medications. It is normal to feel tired for serval weeks after your surgery. It is also normal to have difficulty with sleep habits, eating, and bowel movements after surgery. These will go away with time.  Bathing/Showering  You may shower after you come home. Do not soak in a bathtub, hot tub, or swim until the incision heals completely.  Incision Care  Shower every day. Clean your incision with mild soap and water. Pat the area dry with a clean towel. You do not need a bandage unless otherwise instructed. Do not apply any ointments or creams to your incision. You may have skin glue on your incision. Do not peel it off. It will come off on its own in about one week. Your incision may feel thickened and raised for several weeks after your surgery. This is normal and the skin will soften over time. For Men Only: It's OK to shave around the incision but do not shave the incision itself for 2 weeks. It is common to have numbness under your chin that could last for several months.  Diet  Resume your normal diet. There are no special food restrictions following this procedure. A low fat/low cholesterol diet is recommended for all  patients with vascular disease. In order to heal from your surgery, it is CRITICAL to get adequate nutrition. Your body requires vitamins, minerals, and protein. Vegetables are the best source of vitamins and minerals. Vegetables also provide the perfect balance of protein. Processed food has little nutritional value, so try to avoid this.  Medications  Resume taking all of your medications unless your doctor or physician assistant tells you not to.  If your incision is causing pain,  you may take over-the- counter pain relievers such as acetaminophen  (Tylenol ). If you were prescribed a stronger pain medication, please be aware these medications can cause nausea and constipation.  Prevent nausea by taking the medication with a snack or meal. Avoid constipation by drinking plenty of fluids and eating foods with a high amount of fiber, such as fruits, vegetables, and grains. Do not take Tylenol  if you are taking prescription pain medications.  Follow Up  Our office will schedule a follow up appointment 2-3 weeks following discharge.  Please call us  immediately for any of the following conditions  Increased pain, redness, drainage (pus) from your incision site. Fever of 101 degrees or higher. If you should develop stroke (slurred speech, difficulty swallowing, weakness on one side of your body, loss of vision) you should call 911 and go to the nearest emergency room.  Reduce your risk of vascular disease:  Stop smoking. If you would like help call [facility] at 1-800-QUIT-NOW ((928) 090-2666) or [place] at 854-776-0456. Manage your cholesterol Maintain a desired weight Control your diabetes Keep your blood pressure down  If you have any questions, please call the office at [phone].  Prescriptions given: Percocet 5-325 mg #20 No Refill  Disposition: Home  Patient's condition: is Fair  Follow up: 1. Dr. Pearline in 2 weeks.   Adalie Mand PA-C [facility] [phone]   --- For [facility] Registry use ---   Modified Rankin score at D/C (0-6): 1  IV medication needed for:  1. Hypertension: Yes 2. Hypotension: No  Post-op Complications: No  1. Post-op CVA or TIA: No  If yes: Event classification (right eye, left eye, right cortical, left cortical, verterobasilar, other): n/a  If yes: Timing of event (intra-op, <6 hrs post-op, >=6 hrs post-op, unknown): n/a  2. CN injury: No  If yes: CN not injuried   3. Myocardial infarction: No  If yes: Dx by (EKG or clinical, Troponin): n/a  4.  CHF: No  5.  Dysrhythmia (new): No  6. Wound infection: No  7. Reperfusion symptoms: No  8. Return to OR: No  If yes: return to OR for (bleeding, neurologic, other CEA incision, other): n/a  Discharge medications: Statin use:  Yes ASA use:  Yes   Beta blocker use:  No ACE-Inhibitor use:  No  ARB use:  No CCB use: No P2Y12 Antagonist use: Yes, [ X] Plavix , [ ]  Plasugrel, [ ]  Ticlopinine, [ ]  Ticagrelor, [ ]  Other, [ ]  No for medical reason, [ ]  Non-compliant, [ ]  Not-indicated Anti-coagulant use:  No, [ ]  Warfarin, [ ]  Rivaroxaban, [ ]  Dabigatran,

## 2024-05-10 ENCOUNTER — Other Ambulatory Visit (HOSPITAL_COMMUNITY): Payer: Self-pay
